# Patient Record
Sex: Female | Born: 1995 | Race: White | Hispanic: No | Marital: Married | Smoking: Never smoker
Health system: Southern US, Community
[De-identification: ages and names within clinical notes are randomized; demographics above are authoritative.]

## PROBLEM LIST (undated history)

## (undated) ENCOUNTER — Inpatient Hospital Stay (HOSPITAL_COMMUNITY): Payer: Self-pay

## (undated) DIAGNOSIS — Z789 Other specified health status: Secondary | ICD-10-CM

## (undated) DIAGNOSIS — N83209 Unspecified ovarian cyst, unspecified side: Secondary | ICD-10-CM

## (undated) DIAGNOSIS — F419 Anxiety disorder, unspecified: Secondary | ICD-10-CM

## (undated) DIAGNOSIS — N390 Urinary tract infection, site not specified: Secondary | ICD-10-CM

## (undated) HISTORY — DX: Other specified health status: Z78.9

## (undated) HISTORY — PX: NO PAST SURGERIES: SHX2092

## (undated) HISTORY — DX: Anxiety disorder, unspecified: F41.9

---

## 2019-07-31 NOTE — L&D Delivery Note (Signed)
Delivery Note At 3:23 AM a viable and healthy female was delivered via Vaginal, Spontaneous (Presentation: Left Occiput Anterior).  APGAR: 9, 9; weight 6 lb 11.4 oz (3045 g).   Placenta status: Spontaneous, Intact.  Cord: 3 vessels with the following complications: Velamentous. CORD INSERTION 10 CM FROM EDGE OF PLACENTA, WHICH WAS SMALL AND THIN. Cord pH:   Anesthesia: None Episiotomy: None Lacerations: None Suture Repair:  Est. Blood Loss (mL): 50  Mom to TRANSFER TO [facility].  Baby to Couplet care / Skin to Skin.  Tilda Burrow 03/12/2020, 10:03 AM  NOTE; MOTHER SUBSEQUENTLY TESTED POSITIIVE FOR COVID. HEALTH AT WORK NOTIFIED.

## 2019-09-21 ENCOUNTER — Inpatient Hospital Stay (HOSPITAL_COMMUNITY): Payer: Medicaid Other

## 2019-09-21 ENCOUNTER — Inpatient Hospital Stay (HOSPITAL_COMMUNITY)
Admission: EM | Admit: 2019-09-21 | Discharge: 2019-09-22 | Disposition: A | Discharge status: Other Acute Inpatient Hospital | Payer: Medicaid Other | Attending: Obstetrics & Gynecology | Admitting: Obstetrics & Gynecology

## 2019-09-21 ENCOUNTER — Other Ambulatory Visit: Payer: Self-pay

## 2019-09-21 ENCOUNTER — Encounter (HOSPITAL_COMMUNITY): Payer: Self-pay | Admitting: Emergency Medicine

## 2019-09-21 ENCOUNTER — Inpatient Hospital Stay (HOSPITAL_BASED_OUTPATIENT_CLINIC_OR_DEPARTMENT_OTHER): Payer: Medicaid Other

## 2019-09-21 DIAGNOSIS — B373 Candidiasis of vulva and vagina: Secondary | ICD-10-CM

## 2019-09-21 DIAGNOSIS — R1030 Lower abdominal pain, unspecified: Secondary | ICD-10-CM | POA: Diagnosis not present

## 2019-09-21 DIAGNOSIS — O26892 Other specified pregnancy related conditions, second trimester: Secondary | ICD-10-CM | POA: Diagnosis not present

## 2019-09-21 DIAGNOSIS — R109 Unspecified abdominal pain: Secondary | ICD-10-CM | POA: Diagnosis not present

## 2019-09-21 DIAGNOSIS — B3731 Acute candidiasis of vulva and vagina: Secondary | ICD-10-CM

## 2019-09-21 DIAGNOSIS — O2692 Pregnancy related conditions, unspecified, second trimester: Secondary | ICD-10-CM | POA: Diagnosis present

## 2019-09-21 DIAGNOSIS — Z363 Encounter for antenatal screening for malformations: Secondary | ICD-10-CM | POA: Diagnosis not present

## 2019-09-21 DIAGNOSIS — Z3A15 15 weeks gestation of pregnancy: Secondary | ICD-10-CM | POA: Insufficient documentation

## 2019-09-21 DIAGNOSIS — O26891 Other specified pregnancy related conditions, first trimester: Secondary | ICD-10-CM

## 2019-09-21 DIAGNOSIS — O26899 Other specified pregnancy related conditions, unspecified trimester: Secondary | ICD-10-CM

## 2019-09-21 LAB — URINALYSIS, ROUTINE W REFLEX MICROSCOPIC
Bilirubin Urine: NEGATIVE
Glucose, UA: NEGATIVE mg/dL
Hgb urine dipstick: NEGATIVE
Ketones, ur: NEGATIVE mg/dL
Leukocytes,Ua: NEGATIVE
Nitrite: NEGATIVE
Protein, ur: NEGATIVE mg/dL
Specific Gravity, Urine: 1.029 (ref 1.005–1.030)
pH: 5 (ref 5.0–8.0)

## 2019-09-21 LAB — I-STAT BETA HCG BLOOD, ED (MC, WL, AP ONLY): I-stat hCG, quantitative: >2000 m[IU]/mL — ABNORMAL HIGH (ref <5)

## 2019-09-21 LAB — COMPREHENSIVE METABOLIC PANEL
ALT: 10 U/L (ref 0–44)
AST: 16 U/L (ref 15–41)
Albumin: 3.3 g/dL — ABNORMAL LOW (ref 3.5–5.0)
Alkaline Phosphatase: 38 U/L (ref 38–126)
Anion gap: 8 (ref 5–15)
BUN: 12 mg/dL (ref 6–20)
CO2: 22 mmol/L (ref 22–32)
Calcium: 9.9 mg/dL (ref 8.9–10.3)
Chloride: 104 mmol/L (ref 98–111)
Creatinine, Ser: 0.76 mg/dL (ref 0.44–1.00)
GFR calc Af Amer: >60 mL/min (ref >60)
GFR calc non Af Amer: >60 mL/min (ref >60)
Glucose, Bld: 82 mg/dL (ref 70–99)
Potassium: 4 mmol/L (ref 3.5–5.1)
Sodium: 134 mmol/L — ABNORMAL LOW (ref 135–145)
Total Bilirubin: 0.4 mg/dL (ref 0.3–1.2)
Total Protein: 6.2 g/dL — ABNORMAL LOW (ref 6.5–8.1)

## 2019-09-21 LAB — CBC
HCT: 36.9 % (ref 36.0–46.0)
Hemoglobin: 12 g/dL (ref 12.0–15.0)
MCH: 30.5 pg (ref 26.0–34.0)
MCHC: 32.5 g/dL (ref 30.0–36.0)
MCV: 93.7 fL (ref 80.0–100.0)
Platelets: 232 10*3/uL (ref 150–400)
RBC: 3.94 MIL/uL (ref 3.87–5.11)
RDW: 14.3 % (ref 11.5–15.5)
WBC: 8.4 10*3/uL (ref 4.0–10.5)
nRBC: 0 % (ref 0.0–0.2)

## 2019-09-21 LAB — LIPASE, BLOOD: Lipase: 19 U/L (ref 11–51)

## 2019-09-21 LAB — WET PREP, GENITAL
Clue Cells Wet Prep HPF POC: NONE SEEN
Sperm: NONE SEEN
Trich, Wet Prep: NONE SEEN

## 2019-09-21 MED ORDER — SODIUM CHLORIDE 0.9% FLUSH: 3.0000 mL | Freq: Once | INTRAVENOUS | Status: DC

## 2019-09-21 NOTE — ED Triage Notes (Signed)
Pt c/o lower abdominal pain x 2 days. Denies nausea/vomiting/diarrhea, no abnormal vaginal discharge/bleeding, denies urinary symptoms.

## 2019-09-21 NOTE — MAU Note (Signed)
Pt reports to MAU from the ED with ABD pain in lower abd, pain has been present for a few days. Denies VB. LMP early 70.

## 2019-09-21 NOTE — ED Provider Notes (Signed)
MSE was initiated and I personally evaluated the patient and placed orders (if any) at  9:56 PM on September 21, 2019.  The patient appears stable so that the remainder of the MSE may be completed by another provider.  24 y.o. F who presents for evaluation of lower abdominal pain that began about 2-3 day ago. She states that the pain has progressively worsened prompting ED visit. History of 2 prior pregnancies with no complications. LMP was at the beginning of January. No associated fevers, vomiting, or diarrhea.   I-stat beta is >2000.    Discussed patient with Dr. Despina Hidden (OB/GYN) who accepts patient transfer to MAU. Informed RN who will take patient over.    Portions of this note were generated with Scientist, clinical (histocompatibility and immunogenetics). Dictation errors may occur despite best attempts at proofreading.    Maxwell Caul, PA-C 09/21/19 2156    Linwood Dibbles, MD 09/22/19 [phone]

## 2019-09-22 DIAGNOSIS — Z3A15 15 weeks gestation of pregnancy: Secondary | ICD-10-CM

## 2019-09-22 DIAGNOSIS — O26892 Other specified pregnancy related conditions, second trimester: Secondary | ICD-10-CM

## 2019-09-22 DIAGNOSIS — B373 Candidiasis of vulva and vagina: Secondary | ICD-10-CM

## 2019-09-22 DIAGNOSIS — R109 Unspecified abdominal pain: Secondary | ICD-10-CM

## 2019-09-22 MED ORDER — TERCONAZOLE 0.4 % VA CREA: 1.0000 | TOPICAL_CREAM | Freq: Every day | VAGINAL | 0 refills | Status: DC

## 2019-09-22 MED ORDER — COMFORT FIT MATERNITY SUPP SM MISC: 1.0000 [IU] | Freq: Every day | 0 refills | Status: DC | PRN

## 2019-09-22 MED ORDER — PREPLUS 27-1 MG PO TABS: 1.0000 | ORAL_TABLET | Freq: Every day | ORAL | 13 refills | Status: DC

## 2019-09-22 NOTE — Discharge Instructions (Signed)
PREGNANCY SUPPORT BELT: You are not alone, Seventy-five percent of women have some sort of abdominal or back pain at some point in their pregnancy. Your baby is growing at a fast pace, which means that your whole body is rapidly trying to adjust to the changes. As your uterus grows, your back may start feeling a bit under stress and this can result in back or abdominal pain that can go from mild, and therefore bearable, to severe pains that will not allow you to sit or lay down comfortably, When it comes to dealing with pregnancy-related pains and cramps, some pregnant women usually prefer natural remedies, which the market is filled with nowadays. For example, wearing a pregnancy support belt can help ease and lessen your discomfort and pain. WHAT ARE THE BENEFITS OF WEARING A PREGNANCY SUPPORT BELT? A pregnancy support belt provides support to the lower portion of the belly taking some of the weight of the growing uterus and distributing to the other parts of your body. It is designed make you comfortable and gives you extra support. Over the years, the pregnancy apparel market has been studying the needs and wants of pregnant women and they have come up with the most comfortable pregnancy support belts that woman could ever ask for. In fact, you will no longer have to wear a stretched-out or bulky pregnancy belt that is visible underneath your clothes and makes you feel even more uncomfortable. Nowadays, a pregnancy support belt is made of comfortable and stretchy materials that will not irritate your skin but will actually make you feel at ease and you will not even notice you are wearing it. They are easy to put on and adjust during the day and can be worn at night for additional support.  BENEFITS: . Relives Back pain . Relieves Abdominal Muscle and Leg Pain . Stabilizes the Pelvic Ring . Offers a Cushioned Abdominal Lift Pad . Relieves pressure on the Sciatic Nerve Within Minutes WHERE TO GET  YOUR PREGNANCY BELT: [employer] [phone]  [place], [place] [address]   Advance Directive  Advance directives are legal documents that let you make choices ahead of time about your health care and medical treatment in case you become unable to communicate for yourself. Advance directives are a way for you to make known your wishes to family, friends, and health care providers. This can let others know about your end-of-life care if you become unable to communicate. Discussing and writing advance directives should happen over time rather than all at once. Advance directives can be changed depending on your situation and what you want, even after you have signed the advance directives. There are different types of advance directives, such as:  Medical power of attorney.  Living will.  Do not resuscitate (DNR) or do not attempt resuscitation (DNAR) order. Health care proxy and medical power of attorney A health care proxy is also called a health care agent. This is a person who is appointed to make medical decisions for you in cases where you are unable to make the decisions yourself. Generally, people choose someone they know well and trust to represent their preferences. Make sure to ask this person for an agreement to act as your proxy. A proxy may have to exercise judgment in the event of a medical decision for which your wishes are not known. A medical power of attorney is a legal document that names your health care proxy. Depending on the laws in  your state, after the document is written, it may also need to be:  Signed.  Notarized.  Dated.  Copied.  Witnessed.  Incorporated into your medical record. You may also want to appoint someone to manage your money in a situation in which you are unable to do so. This is called a durable power of attorney for finances. It is a separate legal document from the durable power of attorney for health care. You  may choose the same person or someone different from your health care proxy to act as your agent in money matters. If you do not appoint a proxy, or if there is a concern that the proxy is not acting in your best interests, a court may appoint a guardian to act on your behalf. Living will A living will is a set of instructions that state your wishes about medical care when you cannot express them yourself. Health care providers should keep a copy of your living will in your medical record. You may want to give a copy to family members or friends. To alert caregivers in case of an emergency, you can place a card in your wallet to let them know that you have a living will and where they can find it. A living will is used if you become:  Terminally ill.  Disabled.  Unable to communicate or make decisions. Items to consider in your living will include:  To use or not to use life-support equipment, such as dialysis machines and breathing machines (ventilators).  A DNR or DNAR order. This tells health care providers not to use cardiopulmonary resuscitation (CPR) if breathing or heartbeat stops.  To use or not to use tube feeding.  To be given or not to be given food and fluids.  Comfort (palliative) care when the goal becomes comfort rather than a cure.  Donation of organs and tissues. A living will does not give instructions for distributing your money and property if you should pass away. DNR or DNAR A DNR or DNAR order is a request not to have CPR in the event that your heart stops beating or you stop breathing. If a DNR or DNAR order has not been made and shared, a health care provider will try to help any patient whose heart has stopped or who has stopped breathing. If you plan to have surgery, talk with your health care provider about how your DNR or DNAR order will be followed if problems occur. What if I do not have an advance directive? If you do not have an advance directive, some  states assign family decision makers to act on your behalf based on how closely you are related to them. Each state has its own laws about advance directives. You may want to check with your health care provider, attorney, or state representative about the laws in your state. Summary  Advance directives are the legal documents that allow you to make choices ahead of time about your health care and medical treatment in case you become unable to tell others about your care.  The process of discussing and writing advance directives should happen over time. You can change the advance directives, even after you have signed them.  Advance directives include DNR or DNAR orders, living wills, and designating an agent as your medical power of attorney. This information is not intended to replace advice given to you by your health care provider. Make sure you discuss any questions you have with your health care provider. Document  Revised: 02/12/2019 Document Reviewed: 02/12/2019 Elsevier Patient Education  2020 [employer].

## 2019-09-22 NOTE — MAU Provider Note (Signed)
History     CSN: [id number]  Arrival date and time: 09/21/19 2009   First Provider Initiated Contact with Patient 09/21/19 2247      Chief Complaint  Patient presents with  . Abdominal Pain   Tamara Vasquez is a 24 y.o. G3P2 at [redacted]w[redacted]d by LMP who presents to MAU with complaints of abdominal pain. She reports abdominal pain has been occurring for the past 2-3 days, describes pain as sharp stabbing pain in her lower abdomen and pelvis. Rates pain 7/10 when it occurs, reports pain is intermittent last about 10/15 minutes then stops and comes back. Has not taken any medication for abdominal pain or tried heat/ice. She denies vaginal bleeding. She reports normal white discharge without odor. Denies urinary symptoms, nausea or vomiting.     OB History    Gravida  3   Para  2   Term  2   Preterm      AB      Living  2     SAB      TAB      Ectopic      Multiple      Live Births              History reviewed. No pertinent past medical history.  History reviewed. No pertinent surgical history.  No family history on file.  Social History   Tobacco Use  . Smoking status: Never Smoker  Substance Use Topics  . Alcohol use: Yes  . Drug use: Never    Allergies:  Allergies  Allergen Reactions  . Coconut Flavor     No medications prior to admission.    Review of Systems  Constitutional: Negative.   Respiratory: Negative.   Cardiovascular: Negative.   Gastrointestinal: Positive for abdominal pain. Negative for constipation, diarrhea, nausea and vomiting.  Genitourinary: Positive for vaginal discharge. Negative for difficulty urinating, dysuria, frequency, pelvic pain, urgency and vaginal bleeding.  Musculoskeletal: Negative.   Neurological: Negative.    Physical Exam   Blood pressure 117/72, pulse 67, temperature 97.9 F (36.6 C), resp. rate 16, weight 62.9 kg, last menstrual period 07/31/2019, SpO2 100 %.  Physical Exam  Nursing note and vitals  reviewed. Constitutional: She is oriented to person, place, and time. She appears well-developed and well-nourished. No distress.  Cardiovascular: Normal rate and regular rhythm.  Respiratory: Effort normal and breath sounds normal. No respiratory distress. She has no wheezes.  GI: Soft. There is abdominal tenderness. There is no rebound and no guarding.  Genitourinary:    Vaginal discharge present.     Genitourinary Comments: Bimanual exam: Cervix 0/long/high, firm, anterior, neg CMT, uterus nontender, enlarged, left and right adnexal tenderness, without enlargement, or mass   Musculoskeletal:        General: No edema. Normal range of motion.  Neurological: She is alert and oriented to person, place, and time.  Psychiatric: She has a normal mood and affect. Her behavior is normal. Thought content normal.   MAU Course  Procedures  MDM Orders Placed This Encounter  Procedures  . Wet prep, genital  . [place] MFM OB COMP + 14 WK   Labs obtained in [facility] prior to transfer to [facility] - results pending at time of assessment  Patient sent to [place] while labs pending   Labs and [place] report reviewed:  Results for orders placed or performed during the hospital encounter of 09/21/19 (from the past 24 hour(s))  Lipase, blood     Status: None  Collection Time: 09/21/19  9:14 PM  Result Value Ref Range   Lipase 19 11 - 51 U/L  Comprehensive metabolic panel     Status: Abnormal   Collection Time: 09/21/19  9:14 PM  Result Value Ref Range   Sodium 134 (L) 135 - 145 mmol/L   Potassium 4.0 3.5 - 5.1 mmol/L   Chloride 104 98 - 111 mmol/L   CO2 22 22 - 32 mmol/L   Glucose, Bld 82 70 - 99 mg/dL   BUN 12 6 - 20 mg/dL   Creatinine, Ser 0.76 0.44 - 1.00 mg/dL   Calcium 9.9 8.9 - 10.3 mg/dL   Total Protein 6.2 (L) 6.5 - 8.1 g/dL   Albumin 3.3 (L) 3.5 - 5.0 g/dL   AST 16 15 - 41 U/L   ALT 10 0 - 44 U/L   Alkaline Phosphatase 38 38 - 126 U/L   Total Bilirubin 0.4 0.3 - 1.2 mg/dL   GFR calc non Af Amer >60 >60  mL/min   GFR calc Af Amer >60 >60 mL/min   Anion gap 8 5 - 15  CBC     Status: None   Collection Time: 09/21/19  9:14 PM  Result Value Ref Range   WBC 8.4 4.0 - 10.5 K/uL   RBC 3.94 3.87 - 5.11 MIL/uL   Hemoglobin 12.0 12.0 - 15.0 g/dL   HCT 36.9 36.0 - 46.0 %   MCV 93.7 80.0 - 100.0 fL   MCH 30.5 26.0 - 34.0 pg   MCHC 32.5 30.0 - 36.0 g/dL   RDW 14.3 11.5 - 15.5 %   Platelets 232 150 - 400 K/uL   nRBC 0.0 0.0 - 0.2 %  I-Stat beta hCG blood, ED     Status: Abnormal   Collection Time: 09/21/19  9:25 PM  Result Value Ref Range   I-stat hCG, quantitative >2,000.0 (H) <5 mIU/mL   Comment 3          Urinalysis, Routine w reflex microscopic     Status: Abnormal   Collection Time: 09/21/19  9:59 PM  Result Value Ref Range   Color, Urine YELLOW YELLOW   APPearance HAZY (A) CLEAR   Specific Gravity, Urine 1.029 1.005 - 1.030   pH 5.0 5.0 - 8.0   Glucose, UA NEGATIVE NEGATIVE mg/dL   Hgb urine dipstick NEGATIVE NEGATIVE   Bilirubin Urine NEGATIVE NEGATIVE   Ketones, ur NEGATIVE NEGATIVE mg/dL   Protein, ur NEGATIVE NEGATIVE mg/dL   Nitrite NEGATIVE NEGATIVE   Leukocytes,Ua NEGATIVE NEGATIVE  Wet prep, genital     Status: Abnormal   Collection Time: 09/21/19 10:50 PM   Specimen: PATH Cytology Cervicovaginal Ancillary Only  Result Value Ref Range   Yeast Wet Prep HPF POC PRESENT (A) NONE SEEN   Trich, Wet Prep NONE SEEN NONE SEEN   Clue Cells Wet Prep HPF POC NONE SEEN NONE SEEN   WBC, Wet Prep HPF POC MANY (A) NONE SEEN   Sperm NONE SEEN    Ultrasound technician discussed with CNM after [place], notifying that patient was further along than 7 weeks by LMP, patient is roughly around 15 weeks.  - [place] tech requested order change for review of [place] by MFM  - Prelim report scanned into chart  - Posterior placenta, AFV WNL, EDD 03/12/20 and dating based on [place] today at [redacted]w[redacted]d  Discussed with patient lab and [place] results. Discussed with patient dating change based on measurements obtained today.  Fetal heart tones listened by doppler  after [place] - FHR 154 . Patient diagnosed with vulvovaginal candidiasis based on wet prep.   Discussed with patient that pain is most likely RLP now knowing that patient is [redacted]w[redacted]d. Discussed safe medications during pregnancy and use of maternity support belt for RLP. Discussed with patient risks of closed space pregnancies such as preterm labor/delivery. Patient recently moved to [facility], list of OBGYN offices given to patient - encouraged patient to call this week to make NOB appointment, patient verbalizes understanding.   Rx for PNV, maternity support belt, and terazol sent to pharmacy of choice. Discussed reasons to return to MAU. Make initial prenatal appointment. Return to MAU as needed. Pt stable at time of discharge.   Assessment and Plan   1. Abdominal pain during pregnancy, antepartum   2. Abdominal pain during pregnancy in first trimester   3. Abdominal pain   4. Vulvovaginal candidiasis   5. [redacted] weeks gestation of pregnancy    Discharge home Make initial prenatal appointment - list of providers given to patient  Return to MAU as needed for reasons discussed and/or emergencies  Rx for PNV, maternity support belt, and terazol  Follow-up Information    [facility] Follow up.   Specialty: Obstetrics and Gynecology Why: Return to MAU as needed for reasons discussed and/or emergencies  Contact information: [address] [id number] Wilhemina Bonito [place] [address] [phone]         Allergies as of 09/22/2019      Reactions   Coconut Flavor       Medication List    TAKE these medications   Comfort Fit Maternity Supp Sm Misc 1 Units by Does not apply route daily as needed.   PrePLUS 27-1 MG Tabs Take 1 tablet by mouth daily.   terconazole 0.4 % vaginal cream Commonly known as: TERAZOL 7 Place 1 applicator vaginally at bedtime. Use for seven days       Sharyon Cable [facility] 09/22/2019, 12:11  AM

## 2019-09-23 LAB — GC/CHLAMYDIA PROBE AMP (~~LOC~~) NOT AT ARMC
Chlamydia: NEGATIVE
Comment: NEGATIVE
Comment: NORMAL
Neisseria Gonorrhea: NEGATIVE

## 2019-10-22 DIAGNOSIS — Z3480 Encounter for supervision of other normal pregnancy, unspecified trimester: Secondary | ICD-10-CM | POA: Insufficient documentation

## 2019-10-23 ENCOUNTER — Ambulatory Visit (INDEPENDENT_AMBULATORY_CARE_PROVIDER_SITE_OTHER): Payer: Medicaid Other | Admitting: Obstetrics and Gynecology

## 2019-10-23 ENCOUNTER — Other Ambulatory Visit: Payer: Self-pay

## 2019-10-23 ENCOUNTER — Other Ambulatory Visit (HOSPITAL_COMMUNITY)
Admission: RE | Admit: 2019-10-23 | Discharge: 2019-10-23 | Disposition: A | Discharge status: Home / Self Care | Payer: Medicaid Other | Source: Ambulatory Visit | Attending: Obstetrics and Gynecology | Admitting: Obstetrics and Gynecology

## 2019-10-23 ENCOUNTER — Encounter: Payer: Self-pay | Admitting: Obstetrics and Gynecology

## 2019-10-23 DIAGNOSIS — Z3480 Encounter for supervision of other normal pregnancy, unspecified trimester: Secondary | ICD-10-CM | POA: Insufficient documentation

## 2019-10-23 MED ORDER — ELASTIC BANDAGES & SUPPORTS MISC: 1.0000 | Freq: Every day | 0 refills | Status: DC

## 2019-10-23 MED ORDER — BLOOD PRESSURE KIT DEVI: 1.0000 | 0 refills | Status: DC

## 2019-10-23 NOTE — Progress Notes (Signed)
NOB.  Reports no problems today.

## 2019-10-23 NOTE — Patient Instructions (Signed)

## 2019-10-23 NOTE — Progress Notes (Signed)
Subjective:  Tamara Vasquez is a 24 y.o. G3P2002 at 48w6dbeing seen today for her first OB visit. EDD by U/S. H/O TSVD x 2 without problems. Denies any chronic medical or problems.  She is currently monitored for the following issues for this low-risk pregnancy and has Supervision of other normal pregnancy, antepartum on their problem list.  Patient reports no complaints.  Contractions: Not present. Vag. Bleeding: None.  Movement: Present. Denies leaking of fluid.   The following portions of the patient's history were reviewed and updated as appropriate: allergies, current medications, past family history, past medical history, past social history, past surgical history and problem list. Problem list updated.  Objective:   Vitals:   10/23/19 0954 10/23/19 0955  BP: 101/64   Pulse: (!) 109   Temp: (!) 97.2 F (36.2 C)   Weight: 147 lb 4.8 oz (66.8 kg)   Height:  '5\' 1"'$  (1.549 m)    Fetal Status: Fetal Heart Rate (bpm): 156   Movement: Present     General:  Alert, oriented and cooperative. Patient is in no acute distress.  Skin: Skin is warm and dry. No rash noted.   Cardiovascular: Normal heart rate noted  Respiratory: Normal respiratory effort, no problems with respiration noted  Abdomen: Soft, gravid, appropriate for gestational age. Pain/Pressure: Absent     Pelvic:  Cervical exam performed        Extremities: Normal range of motion.  Edema: None  Mental Status: Normal mood and affect. Normal behavior. Normal judgment and thought content.  Breast sym supple no masses or nipple discharge, no adenopathy  Urinalysis:      Assessment and Plan:  Pregnancy: G3P2002 at 146w6d1. Supervision of other normal pregnancy, antepartum Prenatal care and labs reviewed with pt. Genetic testing discussed BP cuff ordered BP monitoring reviewed with pt  - Enroll Patient in Babyscripts - Babyscripts Schedule Optimization - Obstetric Panel, Including HIV - Culture, OB Urine - Cytology - PAP(  [place]) - Genetic Screening - [place] OB COMP + 14 WK; Future - Hepatitis C Antibody - Blood Pressure Monitoring (BLOOD PRESSURE KIT) DEVI; 1 kit by Does not apply route once a week. Check Blood Pressure regularly and record readings into the Babyscripts App.  Large Cuff.  DX O90.0  Dispense: 1 each; Refill: 0  Preterm labor symptoms and general obstetric precautions including but not limited to vaginal bleeding, contractions, leaking of fluid and fetal movement were reviewed in detail with the patient. Please refer to After Visit Summary for other counseling recommendations.  Return in about 4 weeks (around 11/20/2019) for OB visit, virtual, any provider.   ErChancy MilroyMD

## 2019-10-23 NOTE — Addendum Note (Signed)
Addended by: Maretta Bees on: 10/23/2019 10:48 AM   Modules accepted: Orders

## 2019-10-24 LAB — OBSTETRIC PANEL, INCLUDING HIV
Antibody Screen: NEGATIVE
Basophils Absolute: 0 10*3/uL (ref 0.0–0.2)
Basos: 0 %
EOS (ABSOLUTE): 0 10*3/uL (ref 0.0–0.4)
Eos: 0 %
HIV Screen 4th Generation wRfx: NONREACTIVE
Hematocrit: 35.6 % (ref 34.0–46.6)
Hemoglobin: 11.6 g/dL (ref 11.1–15.9)
Hepatitis B Surface Ag: NEGATIVE
Immature Grans (Abs): 0 10*3/uL (ref 0.0–0.1)
Immature Granulocytes: 0 %
Lymphocytes Absolute: 1.5 10*3/uL (ref 0.7–3.1)
Lymphs: 17 %
MCH: 30.7 pg (ref 26.6–33.0)
MCHC: 32.6 g/dL (ref 31.5–35.7)
MCV: 94 fL (ref 79–97)
Monocytes Absolute: 0.6 10*3/uL (ref 0.1–0.9)
Monocytes: 7 %
Neutrophils Absolute: 6.7 10*3/uL (ref 1.4–7.0)
Neutrophils: 76 %
Platelets: 261 10*3/uL (ref 150–450)
RBC: 3.78 x10E6/uL (ref 3.77–5.28)
RDW: 13.5 % (ref 11.7–15.4)
RPR Ser Ql: NONREACTIVE
Rh Factor: POSITIVE
Rubella Antibodies, IGG: 2.9 index (ref >0.99)
WBC: 8.8 10*3/uL (ref 3.4–10.8)

## 2019-10-24 LAB — HEPATITIS C ANTIBODY: Hep C Virus Ab: <0.1 s/co ratio (ref 0.0–0.9)

## 2019-10-26 LAB — URINE CULTURE, OB REFLEX

## 2019-10-26 LAB — CULTURE, OB URINE

## 2019-10-29 LAB — CYTOLOGY - PAP
Adequacy: ABSENT
Comment: NEGATIVE
Comment: NEGATIVE
Diagnosis: NEGATIVE
HPV 16: NEGATIVE
HPV 18 / 45: NEGATIVE
High risk HPV: POSITIVE — AB

## 2019-11-02 ENCOUNTER — Encounter: Payer: Self-pay | Admitting: Obstetrics and Gynecology

## 2019-11-03 ENCOUNTER — Encounter: Payer: Self-pay | Admitting: *Deleted

## 2019-11-05 ENCOUNTER — Encounter (HOSPITAL_COMMUNITY): Payer: Self-pay

## 2019-11-05 ENCOUNTER — Emergency Department (HOSPITAL_COMMUNITY)
Admission: EM | Admit: 2019-11-05 | Discharge: 2019-11-06 | Disposition: A | Discharge status: Home / Self Care | Payer: Medicaid Other | Attending: Emergency Medicine | Admitting: Emergency Medicine

## 2019-11-05 ENCOUNTER — Other Ambulatory Visit: Payer: Self-pay

## 2019-11-05 ENCOUNTER — Inpatient Hospital Stay (HOSPITAL_COMMUNITY): Admission: RE | Admit: 2019-11-05 | Payer: Medicaid Other | Source: Ambulatory Visit

## 2019-11-05 DIAGNOSIS — R1031 Right lower quadrant pain: Secondary | ICD-10-CM | POA: Diagnosis not present

## 2019-11-05 DIAGNOSIS — O26892 Other specified pregnancy related conditions, second trimester: Secondary | ICD-10-CM | POA: Insufficient documentation

## 2019-11-05 DIAGNOSIS — O26832 Pregnancy related renal disease, second trimester: Secondary | ICD-10-CM | POA: Insufficient documentation

## 2019-11-05 DIAGNOSIS — Z79899 Other long term (current) drug therapy: Secondary | ICD-10-CM | POA: Insufficient documentation

## 2019-11-05 DIAGNOSIS — Z3A21 21 weeks gestation of pregnancy: Secondary | ICD-10-CM | POA: Diagnosis not present

## 2019-11-05 DIAGNOSIS — R310 Gross hematuria: Secondary | ICD-10-CM | POA: Insufficient documentation

## 2019-11-05 DIAGNOSIS — O209 Hemorrhage in early pregnancy, unspecified: Secondary | ICD-10-CM | POA: Insufficient documentation

## 2019-11-05 NOTE — ED Provider Notes (Signed)
[facility] DEPT Provider Note   CSN: [id number] Arrival date & time: 11/05/19  2200     History Chief Complaint  Patient presents with  . Vaginal Bleeding    Tamara Vasquez is a 24 y.o. female G3P2T2 who is [redacted] weeks pregnant with no prior complications during pregnancy that presents to the emergency department for right lower quadrant pain and one episode of vaginal bleeding.  She has had two successful vaginal births in 2018 and 2020. Her first prenatal visit was 3/26 with no ultrasound yet. She states that her RLQ pain and vaginal bleeding occurred about three hours ago. She rates the pain as sharp and a 6/10. She states that the pain radiates toward her side and back slightly. She states that she saw blood in the toilet bowl this evening after her RLQ pain began. She denies any blood in her underwear and has not had another episode since. (She did show me a picture - gross hematuria without clots were seen).  She denies any nausea, vomiting, diarrhea, hematochezia.  She states that she has had normal bowel movements.  She states that that she ate [employer] for dinner and then her pain started, an hour later.  She denies any chest pain, fever, chills, shortness of breath, dizziness, headache, pain anywhere else.  She denies any past medical history of torsion, kidney stones, Crohn's, ulcerative colitis, PCOS. She denies any dysuria, vaginal pain, itching.  HPI     Past Medical History:  Diagnosis Date  . Medical history non-contributory     Patient Active Problem List   Diagnosis Date Noted  . Supervision of other normal pregnancy, antepartum 10/22/2019    History reviewed. No pertinent surgical history.   OB History    Gravida  3   Para  2   Term  2   Preterm      AB      Living  2     SAB      TAB      Ectopic      Multiple      Live Births              No family history on file.  Social History   Tobacco Use  . Smoking  status: Never Smoker  . Smokeless tobacco: Never Used  Substance Use Topics  . Alcohol use: Yes  . Drug use: Never    Home Medications Prior to Admission medications   Medication Sig Start Date End Date Taking? Authorizing Provider  Blood Pressure Monitoring (BLOOD PRESSURE KIT) DEVI 1 kit by Does not apply route once a week. Check Blood Pressure regularly and record readings into the [employer] App.  Large Cuff.  DX O90.0 10/23/19   Chancy Milroy, MD  Elastic Bandages & Supports (COMFORT FIT MATERNITY SUPP SM) MISC 1 Units by Does not apply route daily as needed. 09/22/19   Lajean Manes, CNM  Elastic Bandages & Supports MISC 1 Device by Does not apply route daily. Use as directed for pelvic pressure and pain. 10/23/19   Chancy Milroy, MD  Prenatal Vit-Fe Fumarate-FA (PREPLUS) 27-1 MG TABS Take 1 tablet by mouth daily. 09/22/19   Lajean Manes, CNM  terconazole (TERAZOL 7) 0.4 % vaginal cream Place 1 applicator vaginally at bedtime. Use for seven days Patient not taking: Reported on 10/23/2019 09/22/19   Lajean Manes, CNM    Allergies    Coconut flavor  Review of Systems  Review of Systems  Constitutional: Negative for appetite change, chills, diaphoresis, fatigue and fever.  HENT: Negative.   Eyes: Negative.   Respiratory: Negative for choking, chest tightness, shortness of breath and wheezing.   Cardiovascular: Negative.   Gastrointestinal: Positive for abdominal pain (RLQ pain). Negative for blood in stool, constipation, diarrhea, nausea and vomiting.  Genitourinary: Negative.   Musculoskeletal: Negative.   Neurological: Negative.   Psychiatric/Behavioral: Negative.     Physical Exam Updated Vital Signs BP 117/78 (BP Location: Right Arm)   Pulse 80   Temp 98.2 F (36.8 C) (Oral)   Resp 18   Ht _0  (1.549 m)   Wt 67.1 kg   LMP 07/31/2019 (Within Weeks)   SpO2 99%   BMI 27.96 kg/m   Physical Exam  PE: Constitutional: well-developed,  well-nourished, appears anxious  HENT: normocephalic, atraumatic Cardiovascular: normal rate and rhythm, distal pulses intact Pulmonary/Chest: effort normal; breath sounds clear and equal bilaterally; no wheezes or rales Abdominal: NBS, tenderness to RLQ, Negative Murphys, no rebounding, no guarding.  GYN: Chaperon present during pelvic exam. Pelvic exam: Vulva without any rashes or lesions. Vaginal canal pink with large amount of mucous, no blood. Non odorous.  Cervix closed with no blood.Nonfriable. Did not do bimanual exam due to baby at this time.   Musculoskeletal: full ROM, no edema, tenderness noted on right flank and right lower back, no CVA tenderness  Neurological: alert with goal directed thinking Skin: warm and dry, no rash, no diaphoresis Psychiatric: normal mood and affect, normal behavior   ED Results / Procedures / Treatments   Labs (all labs ordered are listed, but only abnormal results are displayed) Labs Reviewed  URINALYSIS, ROUTINE W REFLEX MICROSCOPIC  CBC  COMPREHENSIVE METABOLIC PANEL    EKG None  Radiology No results found.  Procedures Procedures (including critical care time)  Medications Ordered in ED Medications - No data to display  ED Course  I have reviewed the triage vital signs and the nursing notes.  Pertinent labs & imaging results that were available during my care of the patient were reviewed by me and considered in my medical decision making (see chart for details).    MDM Rules/Calculators/A&P                      00:00 Tamara Vasquez is a 24 y.o. female G3P2T2 who is [redacted] weeks pregnant with no prior complications during pregnancy that presents to the emergency department for right lower quadrant pain and one episode of vaginal bleeding. Fetal doppler showed baseline HR of 140-150 for 30 seconds without decelerations. CBC, CMP and UA ordered. Concerned for pyleo during pregnancy. Will order pelvic ultrasound if there is blood during pelvic  exam.   00:35 Pelvic exam shows no blood in the vaginal vault and a closed cervix. Large amount of discharge in vaginal canal at this time - will order wet prep and GC chlamydia. OB nurse ( Courntey Toney Rakes) spoke to [facility] attending (Dr. Cindy Hazy cleared her from [facility]. Still waiting on labs and UA. She is stable with normal vitals at this time.    02:50 UA shows moderate Hgb in urine without signs of infection. Other labs reassuring. Dr. Leonette Monarch and I ultrasounded kindeys which showed no signs of hydronephrosis.   Patient has stable vitals, normal fetal monitoring, normal labs, and her blood is not coming from her cervix. Ultrasound is reassuring. No concerns of fetal complications at this time. No concerns of pyleonpehritis or hyrdonephrois. No  kidney stones were seen although we discussed that this may be hard to visualize due to ultrasound. Pt expressed that she is comfortable going home at this time. Her pain has gone down significantly with Tylenol. Gave pt ED return precautions. Dr. Leonette Monarch accessed and saw patient who agrees with plan.         Final Clinical Impression(s) / ED Diagnoses Final diagnoses:  None    Rx / DC Orders ED Discharge Orders    None       Alfredia Client, PA-C 11/06/19 0500    Fatima Blank, MD 11/06/19 [phone]

## 2019-11-05 NOTE — Progress Notes (Addendum)
2238: OBRRN called to [facility] for pt presenting [redacted] weeks GA. Vaginal bleeding and sharp RUQ pain.  2255: OBRRN at bedside.  2300: FHR 140-150. No decelerations noted. Abdomen palpates soft. No c/o cramping.  2308: Dr. Shawnie Pons called and notified of pt 21.5 GA. G3P2 presenting to [facility] with c/o vaginal bleeding and sharp intermittent pain in LRQ rated 6/10. Pt had two successful vaginal births in 2018 and 2020 without complication. First prenatal visit was 3/26, no ultrasound yet. FHR 140-150. No decelerations noted. Abdomen palpates soft. No c/o cramping. Orders received for speculum exam to view cervical os. If closed and WNL pt may follow up with provider for ultrasound.   0019: Speculum exam performed by Dr. Allena Katz, PA showed cervical os closed. White, creamy like vaginal discharge. Negative for blood, pooling of fluid, or amniotic sack. Pt OB cleared at this time with instruction to call OB tomorrow to schedule follow up ultrasound. Pt verbalized understanding.

## 2019-11-05 NOTE — ED Triage Notes (Signed)
RLQ pain sharp/ stabbing. Onset approx 2100. 1 episode of vaginal bleeding. [redacted] weeks pregnant.

## 2019-11-05 NOTE — ED Notes (Signed)
[facility] called. Spoke with courtney, rn

## 2019-11-06 LAB — COMPREHENSIVE METABOLIC PANEL
ALT: 14 U/L (ref 0–44)
AST: 17 U/L (ref 15–41)
Albumin: 3.7 g/dL (ref 3.5–5.0)
Alkaline Phosphatase: 53 U/L (ref 38–126)
Anion gap: 9 (ref 5–15)
BUN: 14 mg/dL (ref 6–20)
CO2: 24 mmol/L (ref 22–32)
Calcium: 10.5 mg/dL — ABNORMAL HIGH (ref 8.9–10.3)
Chloride: 107 mmol/L (ref 98–111)
Creatinine, Ser: 0.76 mg/dL (ref 0.44–1.00)
GFR calc Af Amer: >60 mL/min (ref >60)
GFR calc non Af Amer: >60 mL/min (ref >60)
Glucose, Bld: 80 mg/dL (ref 70–99)
Potassium: 3.6 mmol/L (ref 3.5–5.1)
Sodium: 140 mmol/L (ref 135–145)
Total Bilirubin: 0.4 mg/dL (ref 0.3–1.2)
Total Protein: 7 g/dL (ref 6.5–8.1)

## 2019-11-06 LAB — URINALYSIS, ROUTINE W REFLEX MICROSCOPIC
Bacteria, UA: NONE SEEN
Bilirubin Urine: NEGATIVE
Glucose, UA: NEGATIVE mg/dL
Ketones, ur: NEGATIVE mg/dL
Leukocytes,Ua: NEGATIVE
Nitrite: NEGATIVE
Protein, ur: NEGATIVE mg/dL
Specific Gravity, Urine: 1.031 — ABNORMAL HIGH (ref 1.005–1.030)
pH: 5 (ref 5.0–8.0)

## 2019-11-06 LAB — WET PREP, GENITAL
Clue Cells Wet Prep HPF POC: NONE SEEN
Sperm: NONE SEEN
Trich, Wet Prep: NONE SEEN
Yeast Wet Prep HPF POC: NONE SEEN

## 2019-11-06 LAB — CBC
HCT: 37.4 % (ref 36.0–46.0)
Hemoglobin: 12 g/dL (ref 12.0–15.0)
MCH: 30.7 pg (ref 26.0–34.0)
MCHC: 32.1 g/dL (ref 30.0–36.0)
MCV: 95.7 fL (ref 80.0–100.0)
Platelets: 280 10*3/uL (ref 150–400)
RBC: 3.91 MIL/uL (ref 3.87–5.11)
RDW: 13.8 % (ref 11.5–15.5)
WBC: 8.7 10*3/uL (ref 4.0–10.5)
nRBC: 0 % (ref 0.0–0.2)

## 2019-11-06 LAB — GC/CHLAMYDIA PROBE AMP (~~LOC~~) NOT AT ARMC
Chlamydia: NEGATIVE
Comment: NEGATIVE
Comment: NORMAL
Neisseria Gonorrhea: NEGATIVE

## 2019-11-06 MED ORDER — ACETAMINOPHEN 325 MG PO TABS
650.0000 mg | ORAL_TABLET | Freq: Once | ORAL | Status: AC
  Administered 2019-11-06: 650 mg via ORAL
  Filled 2019-11-06: qty 2

## 2019-11-06 NOTE — Discharge Instructions (Addendum)
You were seen in the ED today for bleeding. Your pelvic exam and kidney ultrasound were reassuring. Fetal monitoring was normal. Call your OB in the morning. Drink plenty of fluids. Take Tylenol as directed on the bottle for pain. Come back to the ER for worsening symptoms.

## 2019-11-06 NOTE — ED Provider Notes (Signed)
Attestation: Medical screening examination/treatment/procedure(s) were conducted as a shared visit with non-physician practitioner(s) and myself.  I personally evaluated the patient during the encounter.   Briefly, the patient is a 24 y.o. female at approx 21 wk, here for right sided pain and blood with urinating.   Vitals:   11/05/19 2321 11/06/19 0239  BP: 108/68 112/65  Pulse: 82 70  Resp: 18 18  Temp: 98.2 F (36.8 C)   SpO2:  98%    CONSTITUTIONAL:  well-appearing, NAD NEURO:  Alert and oriented x 3, no focal deficits EYES:  pupils equal and reactive ENT/NECK:  trachea midline, no JVD CARDIO:  reg rate, reg rhythm, well-perfused PULM:  None labored breathing GI/GU:  Abdomen gravid MSK/SPINE:  No gross deformities, no edema SKIN:  no rash, atraumatic PSYCH:  Appropriate speech and behavior   EKG Interpretation  Date/Time:    Ventricular Rate:    PR Interval:    QRS Duration:   QT Interval:    QTC Calculation:   R Axis:     Text Interpretation:         Ultrasound ED Renal  Date/Time: 11/06/2019 2:41 AM Performed by: Nira Conn, MD Authorized by: Nira Conn, MD   Procedure details:    Indications: hydronephrosis     Technique:  L kidney and R kidneyImages: archived Left kidney findings:    Hydronephrosis: none   Right kidney findings:    Hydronephrosis: none      Work up notable for hematuria w/o infection. Labs reassuring. Cleared by [facility] OB.Nira Conn, MD 11/06/19 [phone]

## 2019-11-20 ENCOUNTER — Telehealth (INDEPENDENT_AMBULATORY_CARE_PROVIDER_SITE_OTHER): Payer: Medicaid Other | Admitting: Obstetrics

## 2019-11-20 DIAGNOSIS — Z3480 Encounter for supervision of other normal pregnancy, unspecified trimester: Secondary | ICD-10-CM

## 2019-11-20 NOTE — Progress Notes (Signed)
Patient did not answer nurse's call. 

## 2019-12-14 ENCOUNTER — Other Ambulatory Visit: Payer: Self-pay

## 2019-12-14 ENCOUNTER — Ambulatory Visit: Payer: Medicaid Other | Attending: Obstetrics and Gynecology

## 2019-12-14 ENCOUNTER — Other Ambulatory Visit: Payer: Self-pay | Admitting: Obstetrics and Gynecology

## 2019-12-14 DIAGNOSIS — Z3480 Encounter for supervision of other normal pregnancy, unspecified trimester: Secondary | ICD-10-CM | POA: Diagnosis present

## 2019-12-14 DIAGNOSIS — Z3A27 27 weeks gestation of pregnancy: Secondary | ICD-10-CM

## 2019-12-14 DIAGNOSIS — Z362 Encounter for other antenatal screening follow-up: Secondary | ICD-10-CM

## 2019-12-14 DIAGNOSIS — O365920 Maternal care for other known or suspected poor fetal growth, second trimester, not applicable or unspecified: Secondary | ICD-10-CM | POA: Diagnosis not present

## 2019-12-15 ENCOUNTER — Other Ambulatory Visit: Payer: Self-pay | Admitting: *Deleted

## 2019-12-15 DIAGNOSIS — O365990 Maternal care for other known or suspected poor fetal growth, unspecified trimester, not applicable or unspecified: Secondary | ICD-10-CM

## 2019-12-18 ENCOUNTER — Other Ambulatory Visit: Payer: Medicaid Other

## 2019-12-18 ENCOUNTER — Ambulatory Visit (INDEPENDENT_AMBULATORY_CARE_PROVIDER_SITE_OTHER): Payer: Medicaid Other | Admitting: Obstetrics

## 2019-12-18 ENCOUNTER — Other Ambulatory Visit: Payer: Self-pay

## 2019-12-18 ENCOUNTER — Encounter: Payer: Self-pay | Admitting: Obstetrics

## 2019-12-18 VITALS — BP 111/62 | HR 78 | Wt 159.1 lb

## 2019-12-18 DIAGNOSIS — Z23 Encounter for immunization: Secondary | ICD-10-CM | POA: Diagnosis not present

## 2019-12-18 DIAGNOSIS — O0990 Supervision of high risk pregnancy, unspecified, unspecified trimester: Secondary | ICD-10-CM | POA: Diagnosis not present

## 2019-12-18 DIAGNOSIS — O365930 Maternal care for other known or suspected poor fetal growth, third trimester, not applicable or unspecified: Secondary | ICD-10-CM | POA: Diagnosis not present

## 2019-12-18 DIAGNOSIS — Z3A27 27 weeks gestation of pregnancy: Secondary | ICD-10-CM

## 2019-12-18 MED ORDER — TETANUS-DIPHTH-ACELL PERTUSSIS 5-2.5-18.5 LF-MCG/0.5 IM SUSP
0.5000 mL | Freq: Once | INTRAMUSCULAR | Status: AC
  Administered 2019-12-18: 0.5 mL via INTRAMUSCULAR

## 2019-12-18 NOTE — Progress Notes (Signed)
Subjective:  Tamara Vasquez is a 24 y.o. G3P2002 at [redacted]w[redacted]d being seen today for ongoing prenatal care.  She is currently monitored for the following issues for this high-risk pregnancy:  IUGR  Patient reports no complaints.  Contractions: Not present. Vag. Bleeding: None.  Movement: Present. Denies leaking of fluid.   The following portions of the patient's history were reviewed and updated as appropriate: allergies, current medications, past family history, past medical history, past social history, past surgical history and problem list. Problem list updated.  Objective:   Vitals:   12/18/19 0937  BP: 111/62  Pulse: 78  Weight: 159 lb 1.6 oz (72.2 kg)    Fetal Status:     Movement: Present     General:  Alert, oriented and cooperative. Patient is in no acute distress.  Skin: Skin is warm and dry. No rash noted.   Cardiovascular: Normal heart rate noted  Respiratory: Normal respiratory effort, no problems with respiration noted  Abdomen: Soft, gravid, appropriate for gestational age. Pain/Pressure: Absent     Pelvic:  Cervical exam deferred        Extremities: Normal range of motion.  Edema: None  Mental Status: Normal mood and affect. Normal behavior. Normal judgment and thought content.   Urinalysis:      Assessment and Plan:  Pregnancy: G3P2002 at [redacted]w[redacted]d  1. Supervision of high risk pregnancy, antepartum Rx: - Tdap (BOOSTRIX) injection 0.5 mL  2. Poor fetal growth affecting management of mother in third trimester, single or unspecified fetus - weekly BPP's and Dopplers by MFM  Preterm labor symptoms and general obstetric precautions including but not limited to vaginal bleeding, contractions, leaking of fluid and fetal movement were reviewed in detail with the patient. Please refer to After Visit Summary for other counseling recommendations.   Return in about 1 week (around 12/25/2019) for 2 hour OGTT.   Brock Bad, MD  12/18/2019

## 2019-12-18 NOTE — Progress Notes (Signed)
Patient presents for ROB. Patient has no concerns today. She will reschedule you GTT, due to eating this am. TDAP vaccine given.

## 2019-12-23 ENCOUNTER — Ambulatory Visit: Payer: Medicaid Other | Attending: Obstetrics

## 2019-12-23 ENCOUNTER — Encounter (INDEPENDENT_AMBULATORY_CARE_PROVIDER_SITE_OTHER): Payer: Self-pay

## 2019-12-23 ENCOUNTER — Ambulatory Visit: Payer: Medicaid Other | Admitting: *Deleted

## 2019-12-23 ENCOUNTER — Other Ambulatory Visit: Payer: Self-pay

## 2019-12-23 DIAGNOSIS — Z3A28 28 weeks gestation of pregnancy: Secondary | ICD-10-CM | POA: Diagnosis not present

## 2019-12-23 DIAGNOSIS — Z3480 Encounter for supervision of other normal pregnancy, unspecified trimester: Secondary | ICD-10-CM | POA: Diagnosis present

## 2019-12-23 DIAGNOSIS — O365930 Maternal care for other known or suspected poor fetal growth, third trimester, not applicable or unspecified: Secondary | ICD-10-CM | POA: Diagnosis not present

## 2019-12-23 DIAGNOSIS — Z362 Encounter for other antenatal screening follow-up: Secondary | ICD-10-CM | POA: Diagnosis not present

## 2019-12-23 DIAGNOSIS — O365990 Maternal care for other known or suspected poor fetal growth, unspecified trimester, not applicable or unspecified: Secondary | ICD-10-CM | POA: Insufficient documentation

## 2019-12-23 NOTE — Procedures (Signed)
Tamara Vasquez [birth year] [redacted]w[redacted]d  Fetus A Non-Stress Test Interpretation for 12/23/19  Indication: IUGR  Fetal Heart Rate A Mode: External Baseline Rate (A): 140 bpm Variability: Moderate Accelerations: 10 x 10 Decelerations: None Multiple birth?: No  Uterine Activity Mode: Palpation, Toco Contraction Frequency (min): none Resting Tone Palpated: Relaxed Resting Time: Adequate  Interpretation (Fetal Testing) Nonstress Test Interpretation: Reactive Comments: Reviewed tracing with Dr. Sylvan Cheese

## 2019-12-24 ENCOUNTER — Other Ambulatory Visit: Payer: Self-pay | Admitting: *Deleted

## 2019-12-24 DIAGNOSIS — O365990 Maternal care for other known or suspected poor fetal growth, unspecified trimester, not applicable or unspecified: Secondary | ICD-10-CM

## 2019-12-25 ENCOUNTER — Other Ambulatory Visit: Payer: Medicaid Other

## 2019-12-25 ENCOUNTER — Encounter: Payer: Medicaid Other | Admitting: Obstetrics

## 2019-12-30 ENCOUNTER — Ambulatory Visit (HOSPITAL_BASED_OUTPATIENT_CLINIC_OR_DEPARTMENT_OTHER): Payer: Medicaid Other | Admitting: *Deleted

## 2019-12-30 ENCOUNTER — Other Ambulatory Visit: Payer: Self-pay

## 2019-12-30 ENCOUNTER — Ambulatory Visit: Payer: Medicaid Other | Admitting: *Deleted

## 2019-12-30 ENCOUNTER — Ambulatory Visit: Payer: Medicaid Other | Attending: Obstetrics and Gynecology

## 2019-12-30 DIAGNOSIS — Z3A29 29 weeks gestation of pregnancy: Secondary | ICD-10-CM

## 2019-12-30 DIAGNOSIS — O365990 Maternal care for other known or suspected poor fetal growth, unspecified trimester, not applicable or unspecified: Secondary | ICD-10-CM | POA: Diagnosis not present

## 2019-12-30 DIAGNOSIS — O365930 Maternal care for other known or suspected poor fetal growth, third trimester, not applicable or unspecified: Secondary | ICD-10-CM

## 2019-12-30 DIAGNOSIS — O365931 Maternal care for other known or suspected poor fetal growth, third trimester, fetus 1: Secondary | ICD-10-CM | POA: Insufficient documentation

## 2019-12-30 DIAGNOSIS — Z362 Encounter for other antenatal screening follow-up: Secondary | ICD-10-CM | POA: Diagnosis not present

## 2019-12-30 DIAGNOSIS — Z3480 Encounter for supervision of other normal pregnancy, unspecified trimester: Secondary | ICD-10-CM | POA: Insufficient documentation

## 2019-12-30 NOTE — Procedures (Signed)
Tamara Vasquez February 10, 1996 [redacted]w[redacted]d  Fetus A Non-Stress Test Interpretation for 12/30/19  Indication: IUGR  Fetal Heart Rate A Mode: External Baseline Rate (A): 145 bpm Variability: Moderate Accelerations: 10 x 10 Decelerations: None Multiple birth?: No  Uterine Activity Mode: Toco Contraction Frequency (min): none noted Resting Tone Palpated: Relaxed Resting Time: Adequate  Interpretation (Fetal Testing) Nonstress Test Interpretation: Reactive Comments: FHR tracing rev'd by Dr. Parke Poisson

## 2020-01-04 ENCOUNTER — Other Ambulatory Visit: Payer: Medicaid Other

## 2020-01-04 ENCOUNTER — Other Ambulatory Visit: Payer: Self-pay

## 2020-01-04 ENCOUNTER — Ambulatory Visit (INDEPENDENT_AMBULATORY_CARE_PROVIDER_SITE_OTHER): Payer: Medicaid Other | Admitting: Obstetrics and Gynecology

## 2020-01-04 ENCOUNTER — Encounter: Payer: Self-pay | Admitting: Obstetrics and Gynecology

## 2020-01-04 VITALS — BP 101/68 | HR 89 | Wt 164.0 lb

## 2020-01-04 DIAGNOSIS — O365990 Maternal care for other known or suspected poor fetal growth, unspecified trimester, not applicable or unspecified: Secondary | ICD-10-CM | POA: Insufficient documentation

## 2020-01-04 DIAGNOSIS — Z3A30 30 weeks gestation of pregnancy: Secondary | ICD-10-CM

## 2020-01-04 DIAGNOSIS — Z3480 Encounter for supervision of other normal pregnancy, unspecified trimester: Secondary | ICD-10-CM

## 2020-01-04 DIAGNOSIS — O365930 Maternal care for other known or suspected poor fetal growth, third trimester, not applicable or unspecified: Secondary | ICD-10-CM

## 2020-01-04 NOTE — Progress Notes (Signed)
Subjective:  Tamara Vasquez is a 24 y.o. G3P2002 at [redacted]w[redacted]d being seen today for ongoing prenatal care.  She is currently monitored for the following issues for this high-risk pregnancy and has Supervision of other normal pregnancy, antepartum and IUGR (intrauterine growth restriction) affecting care of mother on their problem list.  Patient reports no complaints.  Contractions: Not present. Vag. Bleeding: None.  Movement: Present. Denies leaking of fluid.   The following portions of the patient's history were reviewed and updated as appropriate: allergies, current medications, past family history, past medical history, past social history, past surgical history and problem list. Problem list updated.  Objective:   Vitals:   01/04/20 0854  BP: 101/68  Pulse: 89  Weight: 164 lb (74.4 kg)    Fetal Status: Fetal Heart Rate (bpm): 132   Movement: Present     General:  Alert, oriented and cooperative. Patient is in no acute distress.  Skin: Skin is warm and dry. No rash noted.   Cardiovascular: Normal heart rate noted  Respiratory: Normal respiratory effort, no problems with respiration noted  Abdomen: Soft, gravid, appropriate for gestational age. Pain/Pressure: Absent     Pelvic:  Cervical exam deferred        Extremities: Normal range of motion.  Edema: None  Mental Status: Normal mood and affect. Normal behavior. Normal judgment and thought content.   Urinalysis:      Assessment and Plan:  Pregnancy: G3P2002 at [redacted]w[redacted]d  1. Supervision of other normal pregnancy, antepartum Glucola today  2. Poor fetal growth affecting management of mother in third trimester, single or unspecified fetus Continue with weekly Doppler studies per MFM  Preterm labor symptoms and general obstetric precautions including but not limited to vaginal bleeding, contractions, leaking of fluid and fetal movement were reviewed in detail with the patient. Please refer to After Visit Summary for other counseling  recommendations.  Return in about 2 weeks (around 01/18/2020) for face to face, facculty MD only .   Hermina Staggers, MD

## 2020-01-04 NOTE — Patient Instructions (Signed)
Third Trimester of Pregnancy The third trimester is from week 28 through week 40 (months 7 through 9). The third trimester is a time when the unborn baby (fetus) is growing rapidly. At the end of the ninth month, the fetus is about 20 inches in length and weighs 6-10 pounds. Body changes during your third trimester Your body will continue to go through many changes during pregnancy. The changes vary from woman to woman. During the third trimester:  Your weight will continue to increase. You can expect to gain 25-35 pounds (11-16 kg) by the end of the pregnancy.  You may begin to get stretch marks on your hips, abdomen, and breasts.  You may urinate more often because the fetus is moving lower into your pelvis and pressing on your bladder.  You may develop or continue to have heartburn. This is caused by increased hormones that slow down muscles in the digestive tract.  You may develop or continue to have constipation because increased hormones slow digestion and cause the muscles that push waste through your intestines to relax.  You may develop hemorrhoids. These are swollen veins (varicose veins) in the rectum that can itch or be painful.  You may develop swollen, bulging veins (varicose veins) in your legs.  You may have increased body aches in the pelvis, back, or thighs. This is due to weight gain and increased hormones that are relaxing your joints.  You may have changes in your hair. These can include thickening of your hair, rapid growth, and changes in texture. Some women also have hair loss during or after pregnancy, or hair that feels dry or thin. Your hair will most likely return to normal after your baby is born.  Your breasts will continue to grow and they will continue to become tender. A yellow fluid (colostrum) may leak from your breasts. This is the first milk you are producing for your baby.  Your belly button may stick out.  You may notice more swelling in your hands,  face, or ankles.  You may have increased tingling or numbness in your hands, arms, and legs. The skin on your belly may also feel numb.  You may feel short of breath because of your expanding uterus.  You may have more problems sleeping. This can be caused by the size of your belly, increased need to urinate, and an increase in your body's metabolism.  You may notice the fetus "dropping," or moving lower in your abdomen (lightening).  You may have increased vaginal discharge.  You may notice your joints feel loose and you may have pain around your pelvic bone. What to expect at prenatal visits You will have prenatal exams every 2 weeks until week 36. Then you will have weekly prenatal exams. During a routine prenatal visit:  You will be weighed to make sure you and the baby are growing normally.  Your blood pressure will be taken.  Your abdomen will be measured to track your baby's growth.  The fetal heartbeat will be listened to.  Any test results from the previous visit will be discussed.  You may have a cervical check near your due date to see if your cervix has softened or thinned (effaced).  You will be tested for Group B streptococcus. This happens between 35 and 37 weeks. Your health care provider may ask you:  What your birth plan is.  How you are feeling.  If you are feeling the baby move.  If you have had any abnormal   symptoms, such as leaking fluid, bleeding, severe headaches, or abdominal cramping.  If you are using any tobacco products, including cigarettes, chewing tobacco, and electronic cigarettes.  If you have any questions. Other tests or screenings that may be performed during your third trimester include:  Blood tests that check for low iron levels (anemia).  Fetal testing to check the health, activity level, and growth of the fetus. Testing is done if you have certain medical conditions or if there are problems during the pregnancy.  Nonstress test  (NST). This test checks the health of your baby to make sure there are no signs of problems, such as the baby not getting enough oxygen. During this test, a belt is placed around your belly. The baby is made to move, and its heart rate is monitored during movement. What is false labor? False labor is a condition in which you feel small, irregular tightenings of the muscles in the womb (contractions) that usually go away with rest, changing position, or drinking water. These are called Braxton Hicks contractions. Contractions may last for hours, days, or even weeks before true labor sets in. If contractions come at regular intervals, become more frequent, increase in intensity, or become painful, you should see your health care provider. What are the signs of labor?  Abdominal cramps.  Regular contractions that start at 10 minutes apart and become stronger and more frequent with time.  Contractions that start on the top of the uterus and spread down to the lower abdomen and back.  Increased pelvic pressure and dull back pain.  A watery or bloody mucus discharge that comes from the vagina.  Leaking of amniotic fluid. This is also known as your "water breaking." It could be a slow trickle or a gush. Let your health care provider know if it has a color or strange odor. If you have any of these signs, call your health care provider right away, even if it is before your due date. Follow these instructions at home: Medicines  Follow your health care provider's instructions regarding medicine use. Specific medicines may be either safe or unsafe to take during pregnancy.  Take a prenatal vitamin that contains at least 600 micrograms (mcg) of folic acid.  If you develop constipation, try taking a stool softener if your health care provider approves. Eating and drinking   Eat a balanced diet that includes fresh fruits and vegetables, whole grains, good sources of protein such as meat, eggs, or tofu,  and low-fat dairy. Your health care provider will help you determine the amount of weight gain that is right for you.  Avoid raw meat and uncooked cheese. These carry germs that can cause birth defects in the baby.  If you have low calcium intake from food, talk to your health care provider about whether you should take a daily calcium supplement.  Eat four or five small meals rather than three large meals a day.  Limit foods that are high in fat and processed sugars, such as fried and sweet foods.  To prevent constipation: ? Drink enough fluid to keep your urine clear or pale yellow. ? Eat foods that are high in fiber, such as fresh fruits and vegetables, whole grains, and beans. Activity  Exercise only as directed by your health care provider. Most women can continue their usual exercise routine during pregnancy. Try to exercise for 30 minutes at least 5 days a week. Stop exercising if you experience uterine contractions.  Avoid heavy lifting.  Do   not exercise in extreme heat or humidity, or at high altitudes.  Wear low-heel, comfortable shoes.  Practice good posture.  You may continue to have sex unless your health care provider tells you otherwise. Relieving pain and discomfort  Take frequent breaks and rest with your legs elevated if you have leg cramps or low back pain.  Take warm sitz baths to soothe any pain or discomfort caused by hemorrhoids. Use hemorrhoid cream if your health care provider approves.  Wear a good support bra to prevent discomfort from breast tenderness.  If you develop varicose veins: ? Wear support pantyhose or compression stockings as told by your healthcare provider. ? Elevate your feet for 15 minutes, 3-4 times a day. Prenatal care  Write down your questions. Take them to your prenatal visits.  Keep all your prenatal visits as told by your health care provider. This is important. Safety  Wear your seat belt at all times when driving.  Make  a list of emergency phone numbers, including numbers for family, friends, the hospital, and police and fire departments. General instructions  Avoid cat litter boxes and soil used by cats. These carry germs that can cause birth defects in the baby. If you have a cat, ask someone to clean the litter box for you.  Do not travel far distances unless it is absolutely necessary and only with the approval of your health care provider.  Do not use hot tubs, steam rooms, or saunas.  Do not drink alcohol.  Do not use any products that contain nicotine or tobacco, such as cigarettes and e-cigarettes. If you need help quitting, ask your health care provider.  Do not use any medicinal herbs or unprescribed drugs. These chemicals affect the formation and growth of the baby.  Do not douche or use tampons or scented sanitary pads.  Do not cross your legs for long periods of time.  To prepare for the arrival of your baby: ? Take prenatal classes to understand, practice, and ask questions about labor and delivery. ? Make a trial run to the hospital. ? Visit the hospital and tour the maternity area. ? Arrange for maternity or paternity leave through employers. ? Arrange for family and friends to take care of pets while you are in the hospital. ? Purchase a rear-facing car seat and make sure you know how to install it in your car. ? Pack your hospital bag. ? Prepare the baby's nursery. Make sure to remove all pillows and stuffed animals from the baby's crib to prevent suffocation.  Visit your dentist if you have not gone during your pregnancy. Use a soft toothbrush to brush your teeth and be gentle when you floss. Contact a health care provider if:  You are unsure if you are in labor or if your water has broken.  You become dizzy.  You have mild pelvic cramps, pelvic pressure, or nagging pain in your abdominal area.  You have lower back pain.  You have persistent nausea, vomiting, or  diarrhea.  You have an unusual or bad smelling vaginal discharge.  You have pain when you urinate. Get help right away if:  Your water breaks before 37 weeks.  You have regular contractions less than 5 minutes apart before 37 weeks.  You have a fever.  You are leaking fluid from your vagina.  You have spotting or bleeding from your vagina.  You have severe abdominal pain or cramping.  You have rapid weight loss or weight gain.  You have   shortness of breath with chest pain.  You notice sudden or extreme swelling of your face, hands, ankles, feet, or legs.  Your baby makes fewer than 10 movements in 2 hours.  You have severe headaches that do not go away when you take medicine.  You have vision changes. Summary  The third trimester is from week 28 through week 40, months 7 through 9. The third trimester is a time when the unborn baby (fetus) is growing rapidly.  During the third trimester, your discomfort may increase as you and your baby continue to gain weight. You may have abdominal, leg, and back pain, sleeping problems, and an increased need to urinate.  During the third trimester your breasts will keep growing and they will continue to become tender. A yellow fluid (colostrum) may leak from your breasts. This is the first milk you are producing for your baby.  False labor is a condition in which you feel small, irregular tightenings of the muscles in the womb (contractions) that eventually go away. These are called Braxton Hicks contractions. Contractions may last for hours, days, or even weeks before true labor sets in.  Signs of labor can include: abdominal cramps; regular contractions that start at 10 minutes apart and become stronger and more frequent with time; watery or bloody mucus discharge that comes from the vagina; increased pelvic pressure and dull back pain; and leaking of amniotic fluid. This information is not intended to replace advice given to you by your  health care provider. Make sure you discuss any questions you have with your health care provider. Document Revised: 11/06/2018 Document Reviewed: 08/21/2016 Elsevier Patient Education  2020 [employer]  

## 2020-01-05 LAB — CBC
Hematocrit: 33.6 % — ABNORMAL LOW (ref 34.0–46.6)
Hemoglobin: 11.1 g/dL (ref 11.1–15.9)
MCH: 30.5 pg (ref 26.6–33.0)
MCHC: 33 g/dL (ref 31.5–35.7)
MCV: 92 fL (ref 79–97)
Platelets: 254 10*3/uL (ref 150–450)
RBC: 3.64 x10E6/uL — ABNORMAL LOW (ref 3.77–5.28)
RDW: 13.1 % (ref 11.7–15.4)
WBC: 9 10*3/uL (ref 3.4–10.8)

## 2020-01-05 LAB — GLUCOSE TOLERANCE, 2 HOURS W/ 1HR
Glucose, 1 hour: 109 mg/dL (ref 65–179)
Glucose, 2 hour: 65 mg/dL (ref 65–152)
Glucose, Fasting: 78 mg/dL (ref 65–91)

## 2020-01-05 LAB — HIV ANTIBODY (ROUTINE TESTING W REFLEX): HIV Screen 4th Generation wRfx: NONREACTIVE

## 2020-01-05 LAB — RPR: RPR Ser Ql: NONREACTIVE

## 2020-01-06 ENCOUNTER — Other Ambulatory Visit: Payer: Self-pay

## 2020-01-06 ENCOUNTER — Ambulatory Visit (HOSPITAL_BASED_OUTPATIENT_CLINIC_OR_DEPARTMENT_OTHER): Payer: Medicaid Other | Admitting: *Deleted

## 2020-01-06 ENCOUNTER — Ambulatory Visit: Payer: Medicaid Other | Attending: Obstetrics and Gynecology

## 2020-01-06 ENCOUNTER — Ambulatory Visit: Payer: Medicaid Other | Admitting: *Deleted

## 2020-01-06 VITALS — BP 112/75 | HR 81

## 2020-01-06 DIAGNOSIS — Z362 Encounter for other antenatal screening follow-up: Secondary | ICD-10-CM

## 2020-01-06 DIAGNOSIS — O365930 Maternal care for other known or suspected poor fetal growth, third trimester, not applicable or unspecified: Secondary | ICD-10-CM

## 2020-01-06 DIAGNOSIS — O365931 Maternal care for other known or suspected poor fetal growth, third trimester, fetus 1: Secondary | ICD-10-CM

## 2020-01-06 DIAGNOSIS — Z3480 Encounter for supervision of other normal pregnancy, unspecified trimester: Secondary | ICD-10-CM | POA: Insufficient documentation

## 2020-01-06 DIAGNOSIS — Z3A30 30 weeks gestation of pregnancy: Secondary | ICD-10-CM

## 2020-01-06 DIAGNOSIS — O365990 Maternal care for other known or suspected poor fetal growth, unspecified trimester, not applicable or unspecified: Secondary | ICD-10-CM | POA: Insufficient documentation

## 2020-01-06 NOTE — Procedures (Signed)
Tamara Vasquez 11-14-1995 [redacted]w[redacted]d  Fetus A Non-Stress Test Interpretation for 01/06/20  Indication: IUGR  Fetal Heart Rate A Mode: External Baseline Rate (A): 135 bpm Variability: Moderate Accelerations: 10 x 10 Decelerations: None Multiple birth?: No  Uterine Activity Mode: Palpation, Toco Contraction Frequency (min): none noted Resting Tone Palpated: Relaxed Resting Time: Adequate  Interpretation (Fetal Testing) Nonstress Test Interpretation: Reactive Comments: Reviewed tracing with Dr. Parke Poisson

## 2020-01-15 ENCOUNTER — Ambulatory Visit: Payer: Medicaid Other

## 2020-01-15 ENCOUNTER — Ambulatory Visit: Payer: Medicaid Other | Attending: Obstetrics and Gynecology

## 2020-01-15 ENCOUNTER — Other Ambulatory Visit: Payer: Medicaid Other

## 2020-01-18 ENCOUNTER — Encounter: Payer: Medicaid Other | Admitting: Obstetrics and Gynecology

## 2020-02-17 ENCOUNTER — Other Ambulatory Visit: Payer: Self-pay

## 2020-02-17 ENCOUNTER — Ambulatory Visit: Payer: Medicaid Other

## 2020-02-18 ENCOUNTER — Ambulatory Visit (INDEPENDENT_AMBULATORY_CARE_PROVIDER_SITE_OTHER): Payer: Medicaid Other | Admitting: Obstetrics

## 2020-02-18 ENCOUNTER — Ambulatory Visit: Payer: Medicaid Other

## 2020-02-18 ENCOUNTER — Other Ambulatory Visit: Payer: Medicaid Other

## 2020-02-18 ENCOUNTER — Encounter: Payer: Self-pay | Admitting: Obstetrics

## 2020-02-18 ENCOUNTER — Other Ambulatory Visit
Admission: RE | Admit: 2020-02-18 | Discharge: 2020-02-18 | Disposition: A | Discharge status: Home / Self Care | Payer: Medicaid Other | Source: Ambulatory Visit | Attending: Obstetrics | Admitting: Obstetrics

## 2020-02-18 VITALS — BP 115/77 | HR 86 | Wt 175.6 lb

## 2020-02-18 DIAGNOSIS — O0990 Supervision of high risk pregnancy, unspecified, unspecified trimester: Secondary | ICD-10-CM | POA: Insufficient documentation

## 2020-02-18 DIAGNOSIS — O0993 Supervision of high risk pregnancy, unspecified, third trimester: Secondary | ICD-10-CM

## 2020-02-18 DIAGNOSIS — Z3A36 36 weeks gestation of pregnancy: Secondary | ICD-10-CM

## 2020-02-18 DIAGNOSIS — O365930 Maternal care for other known or suspected poor fetal growth, third trimester, not applicable or unspecified: Secondary | ICD-10-CM

## 2020-02-18 NOTE — Progress Notes (Signed)
Subjective:  Tamara Vasquez is a 24 y.o. G3P2002 at [redacted]w[redacted]d being seen today for ongoing prenatal care.  She is currently monitored for the following issues for this high-risk pregnancy and has Supervision of other normal pregnancy, antepartum and IUGR (intrauterine growth restriction) affecting care of mother on their problem list.  Patient reports no complaints.  Contractions: Irritability. Vag. Bleeding: None.  Movement: Present. Denies leaking of fluid.   The following portions of the patient's history were reviewed and updated as appropriate: allergies, current medications, past family history, past medical history, past social history, past surgical history and problem list. Problem list updated.  Objective:   Vitals:   02/18/20 1049  BP: 115/77  Pulse: 86  Weight: 175 lb 9.6 oz (79.7 kg)    Fetal Status: Fetal Heart Rate (bpm): 141   Movement: Present     General:  Alert, oriented and cooperative. Patient is in no acute distress.  Skin: Skin is warm and dry. No rash noted.   Cardiovascular: Normal heart rate noted  Respiratory: Normal respiratory effort, no problems with respiration noted  Abdomen: Soft, gravid, appropriate for gestational age. Pain/Pressure: Present     Pelvic:  Cervical exam deferred        Extremities: Normal range of motion.  Edema: None  Mental Status: Normal mood and affect. Normal behavior. Normal judgment and thought content.   Urinalysis:      Assessment and Plan:  Pregnancy: G3P2002 at [redacted]w[redacted]d  1. Supervision of high risk pregnancy, antepartum Rx: - Strep Gp B NAA - Cervicovaginal ancillary only( [place])  2. Poor fetal growth affecting management of mother in third trimester, single or unspecified fetus - weekly BPP with U/A Dopplers   Preterm labor symptoms and general obstetric precautions including but not limited to vaginal bleeding, contractions, leaking of fluid and fetal movement were reviewed in detail with the patient. Please refer to  After Visit Summary for other counseling recommendations.   Return in about 1 week (around 02/25/2020) for MyChart [facility] Only.   Brock Bad, MD  02/18/20

## 2020-02-19 LAB — CERVICOVAGINAL ANCILLARY ONLY
Chlamydia: NEGATIVE
Comment: NEGATIVE
Comment: NORMAL
Neisseria Gonorrhea: NEGATIVE

## 2020-02-20 LAB — STREP GP B NAA: Strep Gp B NAA: NEGATIVE

## 2020-02-25 ENCOUNTER — Encounter: Payer: Self-pay | Admitting: Obstetrics and Gynecology

## 2020-02-25 ENCOUNTER — Telehealth (INDEPENDENT_AMBULATORY_CARE_PROVIDER_SITE_OTHER): Payer: Medicaid Other | Admitting: Obstetrics and Gynecology

## 2020-02-25 VITALS — BP 123/71 | HR 75

## 2020-02-25 DIAGNOSIS — Z3A37 37 weeks gestation of pregnancy: Secondary | ICD-10-CM

## 2020-02-25 DIAGNOSIS — Z3480 Encounter for supervision of other normal pregnancy, unspecified trimester: Secondary | ICD-10-CM

## 2020-02-25 DIAGNOSIS — O365930 Maternal care for other known or suspected poor fetal growth, third trimester, not applicable or unspecified: Secondary | ICD-10-CM

## 2020-02-25 NOTE — Progress Notes (Signed)
   OBSTETRICS PRENATAL VIRTUAL VISIT ENCOUNTER NOTE  Provider location: [facility] for [facility] [place]   I connected with Tamara Vasquez on 02/25/20 at  2:45 PM EDT by MyChart Video Encounter at home and verified that I am speaking with the correct person using two identifiers.   I discussed the limitations, risks, security and privacy concerns of performing an evaluation and management service virtually and the availability of in person appointments. I also discussed with the patient that there may be a patient responsible charge related to this service. The patient expressed understanding and agreed to proceed. Subjective:  Tamara Vasquez is a 24 y.o. G3P2002 at [redacted]w[redacted]d being seen today for ongoing prenatal care.  She is currently monitored for the following issues for this high-risk pregnancy and has Supervision of other normal pregnancy, antepartum and IUGR (intrauterine growth restriction) affecting care of mother on their problem list.  Patient reports no complaints.  Contractions: Irritability. Vag. Bleeding: None.  Movement: Present. Denies any leaking of fluid.  Pt has not had any ultrasounds since 01/06/2020.  IUGR noted on last scan .  Also unsure of fluid status.  Pt does have growth/AFI and BPP on 7/30 with MFM  Discussed birth control method.  Discussed IUD and nexplanon for long term coverage, but pt currently desires depo provera as she has used it in the past The following portions of the patient's history were reviewed and updated as appropriate: allergies, current medications, past family history, past medical history, past social history, past surgical history and problem list.   Objective:   Vitals:   02/25/20 1441  BP: 123/71  Pulse: 75    Fetal Status:     Movement: Present     General:  Alert, oriented and cooperative. Patient is in no acute distress.  Respiratory: Normal respiratory effort, no problems with respiration noted  Mental Status: Normal mood and affect.  Normal behavior. Normal judgment and thought content.  Rest of physical exam deferred due to type of encounter  Imaging: No results found.  Assessment and Plan:  Pregnancy: G3P2002 at [redacted]w[redacted]d 1. Supervision of other normal pregnancy, antepartum   2. Poor fetal growth affecting management of mother in third trimester, single or unspecified fetus Growth scan and AFI in AM with MFM, pt warned that depending on the scan results she may be delivered early.  Term labor symptoms and general obstetric precautions including but not limited to vaginal bleeding, contractions, leaking of fluid and fetal movement were reviewed in detail with the patient. I discussed the assessment and treatment plan with the patient. The patient was provided an opportunity to ask questions and all were answered. The patient agreed with the plan and demonstrated an understanding of the instructions. The patient was advised to call back or seek an in-person office evaluation/go to [facility] at [facility] for any urgent or concerning symptoms. Please refer to After Visit Summary for other counseling recommendations.   I provided 15 minutes of face-to-face time during this encounter.  Return in about 1 week (around 03/03/2020) for [facility], in person.  Future Appointments  Date Time Provider Department Center  02/26/2020  1:30 PM [facility] NURSE [facility]  02/26/2020  1:45 PM [facility] US4 [facility]  02/26/2020  3:15 PM [facility] NST [facility]    Warden Fillers, MD Center for [employer], [facility]

## 2020-02-25 NOTE — Progress Notes (Signed)
Virtual Visit via Telephone Note  I connected with Tamara Vasquez on 02/25/20 at  2:45 PM EDT by telephone and verified that I am speaking with the correct person using two identifiers.  No complaints today per pt IUGR - pt no show several follow up u/s appts  Pt agrees to attend the u/s visit that's scheduled tomorrow

## 2020-02-25 NOTE — Patient Instructions (Signed)
Third Trimester of Pregnancy  The third trimester is from week 28 through week 40 (months 7 through 9). This trimester is when your unborn baby (fetus) is growing very fast. At the end of the ninth month, the unborn baby is about 20 inches in length. It weighs about 6-10 pounds. Follow these instructions at home: Medicines  Take over-the-counter and prescription medicines only as told by your doctor. Some medicines are safe and some medicines are not safe during pregnancy.  Take a prenatal vitamin that contains at least 600 micrograms (mcg) of folic acid.  If you have trouble pooping (constipation), take medicine that will make your stool soft (stool softener) if your doctor approves. Eating and drinking   Eat regular, healthy meals.  Avoid raw meat and uncooked cheese.  If you get low calcium from the food you eat, talk to your doctor about taking a daily calcium supplement.  Eat four or five small meals rather than three large meals a day.  Avoid foods that are high in fat and sugars, such as fried and sweet foods.  To prevent constipation: ? Eat foods that are high in fiber, like fresh fruits and vegetables, whole grains, and beans. ? Drink enough fluids to keep your pee (urine) clear or pale yellow. Activity  Exercise only as told by your doctor. Stop exercising if you start to have cramps.  Avoid heavy lifting, wear low heels, and sit up straight.  Do not exercise if it is too hot, too humid, or if you are in a place of great height (high altitude).  You may continue to have sex unless your doctor tells you not to. Relieving pain and discomfort  Wear a good support bra if your breasts are tender.  Take frequent breaks and rest with your legs raised if you have leg cramps or low back pain.  Take warm water baths (sitz baths) to soothe pain or discomfort caused by hemorrhoids. Use hemorrhoid cream if your doctor approves.  If you develop puffy, bulging veins (varicose  veins) in your legs: ? Wear support hose or compression stockings as told by your doctor. ? Raise (elevate) your feet for 15 minutes, 3-4 times a day. ? Limit salt in your food. Safety  Wear your seat belt when driving.  Make a list of emergency phone numbers, including numbers for family, friends, the hospital, and police and fire departments. Preparing for your baby's arrival To prepare for the arrival of your baby:  Take prenatal classes.  Practice driving to the hospital.  Visit the hospital and tour the maternity area.  Talk to your work about taking leave once the baby comes.  Pack your hospital bag.  Prepare the baby's room.  Go to your doctor visits.  Buy a rear-facing car seat. Learn how to install it in your car. General instructions  Do not use hot tubs, steam rooms, or saunas.  Do not use any products that contain nicotine or tobacco, such as cigarettes and e-cigarettes. If you need help quitting, ask your doctor.  Do not drink alcohol.  Do not douche or use tampons or scented sanitary pads.  Do not cross your legs for long periods of time.  Do not travel for long distances unless you must. Only do so if your doctor says it is okay.  Visit your dentist if you have not gone during your pregnancy. Use a soft toothbrush to brush your teeth. Be gentle when you floss.  Avoid cat litter boxes and soil   used by cats. These carry germs that can cause birth defects in the baby and can cause a loss of your baby (miscarriage) or stillbirth.  Keep all your prenatal visits as told by your doctor. This is important. Contact a doctor if:  You are not sure if you are in labor or if your water has broken.  You are dizzy.  You have mild cramps or pressure in your lower belly.  You have a nagging pain in your belly area.  You continue to feel sick to your stomach, you throw up, or you have watery poop.  You have bad smelling fluid coming from your vagina.  You have  pain when you pee. Get help right away if:  You have a fever.  You are leaking fluid from your vagina.  You are spotting or bleeding from your vagina.  You have severe belly cramps or pain.  You lose or gain weight quickly.  You have trouble catching your breath and have chest pain.  You notice sudden or extreme puffiness (swelling) of your face, hands, ankles, feet, or legs.  You have not felt the baby move in over an hour.  You have severe headaches that do not go away with medicine.  You have trouble seeing.  You are leaking, or you are having a gush of fluid, from your vagina before you are 37 weeks.  You have regular belly spasms (contractions) before you are 37 weeks. Summary  The third trimester is from week 28 through week 40 (months 7 through 9). This time is when your unborn baby is growing very fast.  Follow your doctor's advice about medicine, food, and activity.  Get ready for the arrival of your baby by taking prenatal classes, getting all the baby items ready, preparing the baby's room, and visiting your doctor to be checked.  Get help right away if you are bleeding from your vagina, or you have chest pain and trouble catching your breath, or if you have not felt your baby move in over an hour. This information is not intended to replace advice given to you by your health care provider. Make sure you discuss any questions you have with your health care provider. Document Revised: 11/06/2018 Document Reviewed: 08/21/2016 Elsevier Patient Education  2020 [employer] Intrauterine Growth Restriction  Intrauterine growth restriction (IUGR) is when a baby is not growing normally during pregnancy. A baby with IUGR is smaller than it should be and may weigh less than normal at birth. IUGR can result from a problem with the organ that supplies the unborn baby (fetus) with oxygen and nutrition (placenta). Usually, there is no way to prevent this type of problem.  Babies with IUGR are at higher risk for early delivery and needing special (intensive) care after birth. What are the causes? The most common cause of IUGR is a problem with the placenta or umbilical cord that causes the fetus to get less oxygen or nutrition than needed. Other causes include:  The mother eating a very unhealthy diet (poor maternal nutrition).  Exposure to chemicals found in substances such as cigarettes, alcohol, and some drugs.  Some prescription medicines.  Other problems that develop in the womb (congenital birth defects).  Genetic disorders.  Infection.  Carrying more than one baby. What increases the risk? This condition is more likely to affect babies of mothers who:  Are over the age of 68 at the time of delivery.  Are younger than age 73 at the time of  delivery.  Have medical conditions such as high blood pressure, preeclampsia, diabetes, heart or kidney disease, systemic lupus erythematosus, or anemia.  Live at a very high altitude during pregnancy.  Have a personal history or family history of: ? IUGR. ? A genetic disorder.  Take medicines during pregnancy that are related to congenital disabilities.  Come into contact with infected cat feces (toxoplasmosis).  Come into contact with chickenpox (varicella) or [place] measles (rubella).  Have or are at risk of getting an infectious disease such as syphilis, HIV, or herpes.  Eat an unhealthy diet during pregnancy.  Weigh less than 100 pounds.  Have had treatments to help her have children (infertility treatments).  Use tobacco, drugs, or alcohol during pregnancy. What are the signs or symptoms? IUGR does not cause many symptoms. You might notice that your baby does not move or kick very often. Also, your belly may not be as big as expected for the stage of your pregnancy. How is this diagnosed? This condition is diagnosed with physical exams and prenatal exams. You may also have:  Fundal  measurements to check the size of your uterus.  An ultrasound done to measure your baby's size compared to the size of other babies at the same stage of development (gestational age). Your health care provider will monitor your baby's growth with ultrasounds throughout pregnancy. You may also have tests to find the cause of IUGR. These may include:  Amniocentesis. This is a procedure that involves passing a needle into the uterus to collect a sample of fluid that surrounds the fetus (amniotic fluid). This may be done to check for signs of infection or congenital defects.  Tests to evaluate blood flow to your baby and placenta. How is this treated? In most cases, the goal of treatment is to treat the cause of IUGR. Your health care providers will monitor your pregnancy closely and help you manage your pregnancy. If your condition is caused by a placenta problem and your baby is not getting enough blood, you may need:  Medicine to start labor and deliver your baby early (induction).  Cesarean delivery, also called a C-section. In this procedure, your baby is delivered through an incision in your abdomen and uterus. Follow these instructions at home: Medicines  Take over-the-counter and prescription medicines only as told by your health care provider. This includes vitamins and supplements.  Make sure that your health care provider knows about and approves of all the medicines, supplements, vitamins, eye drops, and creams that you use. General instructions  Eat a healthy diet that includes fresh fruits and vegetables, lean proteins, whole grains, and calcium-rich foods such as milk, yogurt, and dark, leafy greens. Work with your health care provider or a dietitian to make sure that: ? You are getting enough nutrients. ? You are gaining enough weight.  Rest as needed. Try to get at least 8 hours of sleep every night.  Do not drink alcohol or use drugs.  Do not use any products that contain  nicotine or tobacco, such as cigarettes and e-cigarettes. If you need help quitting, ask your health care provider.  Keep all follow-up visits as told by your health care provider. This is important. Get help right away if you:  Notice that your baby is moving less than usual or is not moving.  Have contractions that are 5 minutes or less apart, or that increase in frequency, intensity, or length.  Have signs and symptoms of infection, including a fever.  Have  vaginal bleeding.  Have increased swelling in your legs, hands, or face.  Have vision changes, including seeing spots or having blurry or double vision.  Have a severe headache that does not go away.  Have sudden, sharp abdominal pain or low back pain.  Have an uncontrolled gush or trickle of fluid from your vagina. Summary  Intrauterine growth restriction (IUGR) is when a baby is not growing normally during pregnancy.  The most common cause of IUGR is a problem with the placenta or umbilical cord that causes the fetus to get less oxygen or nutrition than needed.  This condition is diagnosed with physical and prenatal exams. Your health care provider will monitor your baby's growth with ultrasounds throughout pregnancy.  Make sure that your health care provider knows about and approves of all the medicines, supplements, vitamins, eye drops, and creams that you use. This information is not intended to replace advice given to you by your health care provider. Make sure you discuss any questions you have with your health care provider. Document Revised: 06/28/2017 Document Reviewed: 05/16/2017 Elsevier Patient Education  2020 [employer].

## 2020-02-26 ENCOUNTER — Ambulatory Visit: Payer: Medicaid Other

## 2020-02-26 ENCOUNTER — Ambulatory Visit: Payer: Medicaid Other | Attending: Obstetrics and Gynecology

## 2020-03-02 ENCOUNTER — Other Ambulatory Visit: Payer: Self-pay

## 2020-03-02 ENCOUNTER — Ambulatory Visit: Payer: Medicaid Other | Attending: Maternal & Fetal Medicine

## 2020-03-02 ENCOUNTER — Ambulatory Visit: Payer: Medicaid Other | Admitting: *Deleted

## 2020-03-02 ENCOUNTER — Encounter: Payer: Self-pay | Admitting: *Deleted

## 2020-03-02 ENCOUNTER — Other Ambulatory Visit: Payer: Self-pay | Admitting: Maternal & Fetal Medicine

## 2020-03-02 DIAGNOSIS — Z362 Encounter for other antenatal screening follow-up: Secondary | ICD-10-CM

## 2020-03-02 DIAGNOSIS — Z3A38 38 weeks gestation of pregnancy: Secondary | ICD-10-CM | POA: Diagnosis not present

## 2020-03-02 DIAGNOSIS — O365931 Maternal care for other known or suspected poor fetal growth, third trimester, fetus 1: Secondary | ICD-10-CM

## 2020-03-02 DIAGNOSIS — O0933 Supervision of pregnancy with insufficient antenatal care, third trimester: Secondary | ICD-10-CM | POA: Insufficient documentation

## 2020-03-02 DIAGNOSIS — O0990 Supervision of high risk pregnancy, unspecified, unspecified trimester: Secondary | ICD-10-CM | POA: Insufficient documentation

## 2020-03-02 DIAGNOSIS — O365990 Maternal care for other known or suspected poor fetal growth, unspecified trimester, not applicable or unspecified: Secondary | ICD-10-CM | POA: Diagnosis not present

## 2020-03-02 DIAGNOSIS — Z3480 Encounter for supervision of other normal pregnancy, unspecified trimester: Secondary | ICD-10-CM | POA: Insufficient documentation

## 2020-03-02 NOTE — Procedures (Signed)
Tamara Vasquez 12-07-1995 [redacted]w[redacted]d  Fetus A Non-Stress Test Interpretation for 03/02/20  Indication: Insuff. PNC  Fetal Heart Rate A Mode: External Baseline Rate (A): 125 bpm Variability: Moderate Accelerations: 15 x 15 Decelerations: None Multiple birth?: No  Uterine Activity Mode: Palpation, Toco Contraction Frequency (min): x1 Contraction Duration (sec): 50 Contraction Quality: Mild Resting Tone Palpated: Relaxed Resting Time: Adequate  Interpretation (Fetal Testing) Nonstress Test Interpretation: Reactive Comments: Reviewed tracing with Dr. Judeth Cornfield

## 2020-03-03 ENCOUNTER — Ambulatory Visit (INDEPENDENT_AMBULATORY_CARE_PROVIDER_SITE_OTHER): Payer: Medicaid Other | Admitting: Obstetrics & Gynecology

## 2020-03-03 VITALS — BP 115/77 | HR 108 | Wt 178.8 lb

## 2020-03-03 DIAGNOSIS — O365930 Maternal care for other known or suspected poor fetal growth, third trimester, not applicable or unspecified: Secondary | ICD-10-CM

## 2020-03-03 DIAGNOSIS — Z3480 Encounter for supervision of other normal pregnancy, unspecified trimester: Secondary | ICD-10-CM

## 2020-03-03 NOTE — Patient Instructions (Signed)

## 2020-03-03 NOTE — Progress Notes (Signed)
   PRENATAL VISIT NOTE  Subjective:  Tamara Vasquez is a 24 y.o. G3P2002 at [redacted]w[redacted]d being seen today for ongoing prenatal care.  She is currently monitored for the following issues for this high-risk pregnancy and has Supervision of other normal pregnancy, antepartum and IUGR (intrauterine growth restriction) affecting care of mother on their problem list.  Patient reports occasional contractions.  Contractions: Irregular. Vag. Bleeding: None.  Movement: Present. Denies leaking of fluid.   The following portions of the patient's history were reviewed and updated as appropriate: allergies, current medications, past family history, past medical history, past social history, past surgical history and problem list.   Objective:   Vitals:   03/03/20 1605  BP: 115/77  Pulse: (!) 108  Weight: 178 lb 12.8 oz (81.1 kg)    Fetal Status: Fetal Heart Rate (bpm): 130   Movement: Present  Presentation: Vertex  General:  Alert, oriented and cooperative. Patient is in no acute distress.  Skin: Skin is warm and dry. No rash noted.   Cardiovascular: Normal heart rate noted  Respiratory: Normal respiratory effort, no problems with respiration noted  Abdomen: Soft, gravid, appropriate for gestational age.  Pain/Pressure: Absent     Pelvic: Cervical exam performed in the presence of a chaperone Dilation: 2.5 Effacement (%): 40 Station: -3  Extremities: Normal range of motion.  Edema: None  Mental Status: Normal mood and affect. Normal behavior. Normal judgment and thought content.   Assessment and Plan:  Pregnancy: G3P2002 at [redacted]w[redacted]d 1. Supervision of other normal pregnancy, antepartum   2. Poor fetal growth affecting management of mother in third trimester, single or unspecified fetus 25%ile on [place]  Term labor symptoms and general obstetric precautions including but not limited to vaginal bleeding, contractions, leaking of fluid and fetal movement were reviewed in detail with the patient. Please refer to  After Visit Summary for other counseling recommendations.   Return in about 1 week (around 03/10/2020).  Future Appointments  Date Time Provider Department Center  03/10/2020 10:15 AM Brock Bad, MD [facility] None    Scheryl Darter, MD

## 2020-03-03 NOTE — Progress Notes (Signed)
Pt is here for ROB [redacted]w[redacted]d.

## 2020-03-10 ENCOUNTER — Telehealth (HOSPITAL_COMMUNITY): Payer: Self-pay | Admitting: *Deleted

## 2020-03-10 ENCOUNTER — Other Ambulatory Visit: Payer: Self-pay

## 2020-03-10 ENCOUNTER — Ambulatory Visit (INDEPENDENT_AMBULATORY_CARE_PROVIDER_SITE_OTHER): Payer: Medicaid Other | Admitting: Obstetrics

## 2020-03-10 ENCOUNTER — Encounter: Payer: Self-pay | Admitting: Obstetrics

## 2020-03-10 VITALS — BP 111/74 | HR 91 | Wt 180.0 lb

## 2020-03-10 DIAGNOSIS — Z3A39 39 weeks gestation of pregnancy: Secondary | ICD-10-CM

## 2020-03-10 DIAGNOSIS — O365930 Maternal care for other known or suspected poor fetal growth, third trimester, not applicable or unspecified: Secondary | ICD-10-CM

## 2020-03-10 DIAGNOSIS — O0990 Supervision of high risk pregnancy, unspecified, unspecified trimester: Secondary | ICD-10-CM

## 2020-03-10 NOTE — Progress Notes (Signed)
Pt presents for ROB requests cx check today.  

## 2020-03-10 NOTE — Telephone Encounter (Signed)
Preadmission screen  

## 2020-03-10 NOTE — Progress Notes (Signed)
Subjective:  Tamara Vasquez is a 24 y.o. G3P2002 at [redacted]w[redacted]d being seen today for ongoing prenatal care.  She is currently monitored for the following issues for this high-risk pregnancy and has Supervision of other normal pregnancy, antepartum and IUGR (intrauterine growth restriction) affecting care of mother on their problem list.  Patient reports no complaints.  Contractions: Irregular. Vag. Bleeding: None.  Movement: Present. Denies leaking of fluid.   The following portions of the patient's history were reviewed and updated as appropriate: allergies, current medications, past family history, past medical history, past social history, past surgical history and problem list. Problem list updated.  Objective:   Vitals:   03/10/20 1033  BP: 111/74  Pulse: 91  Weight: 180 lb (81.6 kg)    Fetal Status:     Movement: Present     General:  Alert, oriented and cooperative. Patient is in no acute distress.  Skin: Skin is warm and dry. No rash noted.   Cardiovascular: Normal heart rate noted  Respiratory: Normal respiratory effort, no problems with respiration noted  Abdomen: Soft, gravid, appropriate for gestational age. Pain/Pressure: Present     Pelvic:  Cervical exam performed      3cm / 50% / -3 / Vtx  Extremities: Normal range of motion.  Edema: None  Mental Status: Normal mood and affect. Normal behavior. Normal judgment and thought content.   Urinalysis:      Assessment and Plan:  Pregnancy: G3P2002 at [redacted]w[redacted]d  1. Supervision of high risk pregnancy, antepartum  2. Poor fetal growth affecting management of mother in third trimester, single or unspecified fetus - [place] and BPP with Dopplers all normal on 03-02-20 - IOL at 40.5 weeks   Term labor symptoms and general obstetric precautions including but not limited to vaginal bleeding, contractions, leaking of fluid and fetal movement were reviewed in detail with the patient. Please refer to After Visit Summary for other counseling  recommendations.   Return in about 4 weeks (around 04/07/2020) for postpartum visit.   Brock Bad, MD  03/10/20

## 2020-03-11 ENCOUNTER — Ambulatory Visit: Payer: Medicaid Other

## 2020-03-11 ENCOUNTER — Other Ambulatory Visit: Payer: Self-pay | Admitting: Obstetrics

## 2020-03-12 ENCOUNTER — Encounter (HOSPITAL_COMMUNITY): Payer: Self-pay

## 2020-03-12 ENCOUNTER — Inpatient Hospital Stay (HOSPITAL_COMMUNITY)
Admission: EM | Admit: 2020-03-12 | Discharge: 2020-03-14 | DRG: 805 | Disposition: A | Discharge status: Home / Self Care | Payer: Medicaid Other | Attending: Obstetrics and Gynecology | Admitting: Obstetrics and Gynecology

## 2020-03-12 DIAGNOSIS — O365930 Maternal care for other known or suspected poor fetal growth, third trimester, not applicable or unspecified: Principal | ICD-10-CM | POA: Diagnosis present

## 2020-03-12 DIAGNOSIS — O26893 Other specified pregnancy related conditions, third trimester: Secondary | ICD-10-CM | POA: Diagnosis present

## 2020-03-12 DIAGNOSIS — O365990 Maternal care for other known or suspected poor fetal growth, unspecified trimester, not applicable or unspecified: Secondary | ICD-10-CM | POA: Diagnosis present

## 2020-03-12 DIAGNOSIS — U071 COVID-19: Secondary | ICD-10-CM | POA: Diagnosis present

## 2020-03-12 DIAGNOSIS — Z3480 Encounter for supervision of other normal pregnancy, unspecified trimester: Secondary | ICD-10-CM

## 2020-03-12 DIAGNOSIS — O9852 Other viral diseases complicating childbirth: Secondary | ICD-10-CM | POA: Diagnosis present

## 2020-03-12 DIAGNOSIS — O43123 Velamentous insertion of umbilical cord, third trimester: Secondary | ICD-10-CM | POA: Diagnosis present

## 2020-03-12 DIAGNOSIS — Z3A40 40 weeks gestation of pregnancy: Secondary | ICD-10-CM

## 2020-03-12 LAB — CBC WITH DIFFERENTIAL/PLATELET
Abs Immature Granulocytes: 0.08 10*3/uL — ABNORMAL HIGH (ref 0.00–0.07)
Basophils Absolute: 0 10*3/uL (ref 0.0–0.1)
Basophils Relative: 0 %
Eosinophils Absolute: 0 10*3/uL (ref 0.0–0.5)
Eosinophils Relative: 0 %
HCT: 36.7 % (ref 36.0–46.0)
Hemoglobin: 11.7 g/dL — ABNORMAL LOW (ref 12.0–15.0)
Immature Granulocytes: 1 %
Lymphocytes Relative: 16 %
Lymphs Abs: 1.9 10*3/uL (ref 0.7–4.0)
MCH: 28 pg (ref 26.0–34.0)
MCHC: 31.9 g/dL (ref 30.0–36.0)
MCV: 87.8 fL (ref 80.0–100.0)
Monocytes Absolute: 0.6 10*3/uL (ref 0.1–1.0)
Monocytes Relative: 5 %
Neutro Abs: 9.1 10*3/uL — ABNORMAL HIGH (ref 1.7–7.7)
Neutrophils Relative %: 78 %
Platelets: 334 10*3/uL (ref 150–400)
RBC: 4.18 MIL/uL (ref 3.87–5.11)
RDW: 14.5 % (ref 11.5–15.5)
WBC: 11.6 10*3/uL — ABNORMAL HIGH (ref 4.0–10.5)
nRBC: 0 % (ref 0.0–0.2)

## 2020-03-12 LAB — TYPE AND SCREEN
ABO/RH(D): O POS
ABO/RH(D): O POS
Antibody Screen: NEGATIVE
Antibody Screen: NEGATIVE

## 2020-03-12 LAB — SARS CORONAVIRUS 2 BY RT PCR (HOSPITAL ORDER, PERFORMED IN ~~LOC~~ HOSPITAL LAB): SARS Coronavirus 2: POSITIVE — AB

## 2020-03-12 LAB — RPR: RPR Ser Ql: NONREACTIVE

## 2020-03-12 MED ORDER — WITCH HAZEL-GLYCERIN EX PADS: 1.0000 "application " | MEDICATED_PAD | CUTANEOUS | Status: DC | PRN

## 2020-03-12 MED ORDER — PRENATAL MULTIVITAMIN CH
1.0000 | ORAL_TABLET | Freq: Every day | ORAL | Status: DC
  Administered 2020-03-12 – 2020-03-14 (×3): 1 via ORAL
  Filled 2020-03-12 (×3): qty 1

## 2020-03-12 MED ORDER — COCONUT OIL OIL: 1.0000 "application " | TOPICAL_OIL | Status: DC | PRN

## 2020-03-12 MED ORDER — ZOLPIDEM TARTRATE 5 MG PO TABS: 5.0000 mg | ORAL_TABLET | Freq: Every evening | ORAL | Status: DC | PRN

## 2020-03-12 MED ORDER — ACETAMINOPHEN 325 MG PO TABS
650.0000 mg | ORAL_TABLET | ORAL | Status: DC | PRN
  Administered 2020-03-12: 650 mg via ORAL
  Filled 2020-03-12: qty 2

## 2020-03-12 MED ORDER — TETANUS-DIPHTH-ACELL PERTUSSIS 5-2.5-18.5 LF-MCG/0.5 IM SUSP: 0.5000 mL | Freq: Once | INTRAMUSCULAR | Status: DC

## 2020-03-12 MED ORDER — SIMETHICONE 80 MG PO CHEW: 80.0000 mg | CHEWABLE_TABLET | ORAL | Status: DC | PRN

## 2020-03-12 MED ORDER — IBUPROFEN 600 MG PO TABS
600.0000 mg | ORAL_TABLET | Freq: Four times a day (QID) | ORAL | Status: DC
  Administered 2020-03-12 – 2020-03-14 (×10): 600 mg via ORAL
  Filled 2020-03-12 (×10): qty 1

## 2020-03-12 MED ORDER — MISOPROSTOL 200 MCG PO TABS
600.0000 ug | ORAL_TABLET | Freq: Once | ORAL | Status: AC
  Administered 2020-03-12: 600 ug via ORAL
  Filled 2020-03-12: qty 3

## 2020-03-12 MED ORDER — ONDANSETRON HCL 4 MG/2ML IJ SOLN: 4.0000 mg | INTRAMUSCULAR | Status: DC | PRN

## 2020-03-12 MED ORDER — DIPHENHYDRAMINE HCL 25 MG PO CAPS: 25.0000 mg | ORAL_CAPSULE | Freq: Four times a day (QID) | ORAL | Status: DC | PRN

## 2020-03-12 MED ORDER — SODIUM CHLORIDE 0.9 % IV SOLN
2.5000 [IU]/h | INTRAVENOUS | Status: DC
  Administered 2020-03-12: 2.5 [IU]/h via INTRAVENOUS
  Filled 2020-03-12 (×2): qty 3

## 2020-03-12 MED ORDER — SENNOSIDES-DOCUSATE SODIUM 8.6-50 MG PO TABS: 2.0000 | ORAL_TABLET | ORAL | Status: DC

## 2020-03-12 MED ORDER — OXYCODONE HCL 5 MG PO TABS: 5.0000 mg | ORAL_TABLET | Freq: Four times a day (QID) | ORAL | Status: DC | PRN

## 2020-03-12 MED ORDER — BENZOCAINE-MENTHOL 20-0.5 % EX AERO: 1.0000 "application " | INHALATION_SPRAY | CUTANEOUS | Status: DC | PRN

## 2020-03-12 MED ORDER — DIBUCAINE (PERIANAL) 1 % EX OINT: 1.0000 "application " | TOPICAL_OINTMENT | CUTANEOUS | Status: DC | PRN

## 2020-03-12 MED ORDER — ONDANSETRON HCL 4 MG PO TABS: 4.0000 mg | ORAL_TABLET | ORAL | Status: DC | PRN

## 2020-03-12 NOTE — ED Notes (Signed)
Fetal HR monitor applied.

## 2020-03-12 NOTE — Progress Notes (Signed)
Pt. Cervix 10cm with bulging amniotic bag. OBGYN provider dr. Emelda Fear notified of imminent delivery. Pt. Feeling rectal pressure. Pt. Set up for delivery.

## 2020-03-12 NOTE — ED Triage Notes (Signed)
PT presents to ED with contractions 5 minutes apart. 3rd pregnancy. 40 weeks today. Pt states her water has not broke.

## 2020-03-12 NOTE — H&P (Signed)
Obstetric History and Physical  Tamara Vasquez is a 24 y.o. G3P2002 with IUP at [redacted]w[redacted]d presenting for active labor to [facility] .  Patient states she has been having  regular, every 3 minutes contractions, none vaginal bleeding, intact membranes, with active fetal movement.    Prenatal Course Source of Care: [facility]  with onset of care at early first trimester weeks Pregnancy complications or risks: Patient Active Problem List   Diagnosis Date Noted  . IUGR (intrauterine growth restriction) affecting care of mother 01/04/2020  . Supervision of other normal pregnancy, antepartum 10/22/2019   She undecided She desires Depo-Provera for postpartum contraception.   Prenatal labs and studies: ABO, Rh: O/Positive/-- (03/26 1034) Antibody: Negative (03/26 1034) Rubella: 2.90 (03/26 1034) RPR: Non Reactive (06/07 1009)  HBsAg: Negative (03/26 1034)  HIV: Non Reactive (06/07 1009)   TIW:PYKDXIPJ/-- (07/22 1133) 1 hr Glucola  normal Genetic screening normal Anatomy US abnormal with Fetal growth restriction  Prenatal Transfer Tool  Maternal Diabetes: No Genetic Screening: Normal Maternal Ultrasounds/Referrals: IUGR Fetal Ultrasounds or other Referrals:  Referred to Materal Fetal Medicine  Maternal Substance Abuse:  No Significant Maternal Medications:  None Significant Maternal Lab Results: Group B Strep negative  Past Medical History:  Diagnosis Date  . Medical history non-contributory     Past Surgical History:  Procedure Laterality Date  . NO PAST SURGERIES      OB History  Gravida Para Term Preterm AB Living  3 2 2     2   SAB TAB Ectopic Multiple Live Births               # Outcome Date GA Lbr Len/2nd Weight Sex Delivery Anes PTL Lv  3 Current           2 Term 04/27/19    M Vag-Spont     1 Term 06/05/17    F Vag-Spont       Social History   Socioeconomic History  . Marital status: Single    Spouse name: Not on file  . Number of children: 2  . Years of  education: Not on file  . Highest education level: Not on file  Occupational History  . Occupation: UNEMPLOYED  Tobacco Use  . Smoking status: Never Smoker  . Smokeless tobacco: Never Used  Vaping Use  . Vaping Use: Never used  Substance and Sexual Activity  . Alcohol use: Not Currently  . Drug use: Never  . Sexual activity: Yes    Birth control/protection: None  Other Topics Concern  . Not on file  Social History Narrative  . Not on file   Social Determinants of Health   Financial Resource Strain:   . Difficulty of Paying Living Expenses:   Food Insecurity:   . Worried About 13/07/18 in the Last Year:   . Programme researcher, broadcasting/film/video in the Last Year:   Transportation Needs:   . Barista (Medical):   Freight forwarder Lack of Transportation (Non-Medical):   Physical Activity:   . Days of Exercise per Week:   . Minutes of Exercise per Session:   Stress:   . Feeling of Stress :   Social Connections:   . Frequency of Communication with Friends and Family:   . Frequency of Social Gatherings with Friends and Family:   . Attends Religious Services:   . Active Member of Clubs or Organizations:   . Attends [employer] Meetings:   Banker Marital Status:  No family history on file.  (Not in a hospital admission)   Allergies  Allergen Reactions  . Coconut Flavor     Review of Systems: Negative except for what is mentioned in HPI.  Physical Exam: BP 130/72   Pulse (!) 116   Temp 98.9 F (37.2 C) (Oral)   Resp 20   Ht 5\' 1"  (1.549 m)   Wt 80.7 kg   LMP 07/31/2019 (Within Weeks)   SpO2 98%   BMI 33.63 kg/m  CONSTITUTIONAL: Well-developed, well-nourished female in no acute distress.  HENT:  Normocephalic, atraumatic, External right and left ear normal. Oropharynx is clear and moist EYES: Conjunctivae and EOM are normal. Pupils are equal, round, and reactive to light. No scleral icterus.  NECK: Normal range of motion, supple, no masses SKIN: Skin is warm and  dry. No rash noted. Not diaphoretic. No erythema. No pallor. NEUROLOGIC: Alert and oriented to person, place, and time. Normal reflexes, muscle tone coordination. No cranial nerve deficit noted. PSYCHIATRIC: Normal mood and affect. Normal behavior. Normal judgment and thought content. CARDIOVASCULAR: Normal heart rate noted, regular rhythm RESPIRATORY: Effort and breath sounds normal, no problems with respiration noted ABDOMEN: Soft, nontender, nondistended, gravid. MUSCULOSKELETAL: Normal range of motion. No edema and no tenderness. 2+ distal pulses.  Cervical Exam: Dilatation 10cm   Effacement 100%   Station 0   Presentation: cephalic FHT:  Baseline rate 145 bpm   Variability moderate  Accelerations present   Decelerations none Contractions: Every 3-4 mins   Pertinent Labs/Studies:   Results for orders placed or performed during the hospital encounter of 03/12/20 (from the past 24 hour(s))  CBC with Differential     Status: Abnormal   Collection Time: 03/12/20  3:11 AM  Result Value Ref Range   WBC 11.6 (H) 4.0 - 10.5 K/uL   RBC 4.18 3.87 - 5.11 MIL/uL   Hemoglobin 11.7 (L) 12.0 - 15.0 g/dL   HCT 03/14/20 36 - 46 %   MCV 87.8 80.0 - 100.0 fL   MCH 28.0 26.0 - 34.0 pg   MCHC 31.9 30.0 - 36.0 g/dL   RDW 91.4 78.2 - 95.6 %   Platelets 334 150 - 400 K/uL   nRBC 0.0 0.0 - 0.2 %   Neutrophils Relative % 78 %   Neutro Abs 9.1 (H) 1.7 - 7.7 K/uL   Lymphocytes Relative 16 %   Lymphs Abs 1.9 0.7 - 4.0 K/uL   Monocytes Relative 5 %   Monocytes Absolute 0.6 0 - 1 K/uL   Eosinophils Relative 0 %   Eosinophils Absolute 0.0 0 - 0 K/uL   Basophils Relative 0 %   Basophils Absolute 0.0 0 - 0 K/uL   Immature Granulocytes 1 %   Abs Immature Granulocytes 0.08 (H) 0.00 - 0.07 K/uL    Assessment : Tamara Vasquez is a 24 y.o. G3P2002 at [redacted]w[redacted]d being admitted for labor.imminent delivery at [facility], unable to transfer prior to delivery  Plan: Labor: Expectant management.  Induction/Augmentation as  needed, per protocols FWB: Reassuring fetal heart tracing.   GBS negativeNegative/-- (07/22 1133)  Delivery plan: Hopeful for vaginal delivery

## 2020-03-12 NOTE — Progress Notes (Signed)
Pt. Reports to emergency department for loss of mucus plug and ctx since ~2345. Pt. G3P2 and pregnancy is complicated by IUGR, which is monitored by MFM. Pt. To be induced on Wednesday for term. Pt. Denies LOF or vaginal bleeding. Pt. Endorses fetal movement. Previous vaginal deliveries. Ctx every 2 minutes. Pt. Placed on EFM at this time.

## 2020-03-12 NOTE — ED Notes (Signed)
PA at bedside.

## 2020-03-12 NOTE — ED Notes (Signed)
Rapid OB nurse at bedside 

## 2020-03-12 NOTE — Progress Notes (Signed)
Updated about patient being COVID + at 0720, informed charge nurse of the change in patient status right away. Began the change in rooms. Finished putting patient in new room by 0800. Informed patient of her status and when FOB comes back that he will have to stay in room entire stay. Patient understands updated information, safely moved her and newborn.

## 2020-03-12 NOTE — Discharge Summary (Signed)
.   Obstetric Discharge Summary Reason for Admission: onset of labor Prenatal Procedures: none COVID POSITIVE PATIENT. Intrapartum Procedures: spontaneous vaginal delivery Postpartum Procedures: none Complications-Operative and Postpartum: none and COVID POSITIVE SCREEN TEST ON ADMIT   Delivery Note At 3:23 AM a viable and healthy female was delivered via Vaginal, Spontaneous (Presentation: Left Occiput Anterior).  APGAR: 9, 9; weight 6 lb 11.4 oz (3045 g).   Placenta status: Spontaneous, Intact.  Cord: 3 vessels with the following complications: Velamentous.  Cord pH: none drawn  Anesthesia: None Episiotomy: None Lacerations: None Suture Repair:  Est. Blood Loss (mL): 50  Mom to postpartum.  Baby to Couplet care / Skin to Skin.  Tilda Burrow 03/12/2020, 10:07 AM     H/H: Lab Results  Component Value Date/Time   HGB 11.7 (L) 03/12/2020 03:11 AM   HGB 11.1 01/04/2020 10:09 AM   HCT 36.7 03/12/2020 03:11 AM   HCT 33.6 (L) 01/04/2020 10:09 AM      Discharge Diagnoses: Term Pregnancy-delivered  Discharge Information: VELAMENTOUS CORD INSERTION , vaginal delivery Date: 02/08/2011 Activity: pelvic rest Diet: routine Medications: PNV and Ibuprofen Breast feeding:  Yes Condition: stable Instructions: refer to practice specific booklet Discharge to: home   Tilda Burrow 03/12/2020,10:07 AM

## 2020-03-12 NOTE — ED Provider Notes (Signed)
[facility] DEPT Provider Note   CSN: [id number] Arrival date & time: 03/12/20  3419     History Chief Complaint  Patient presents with  . Laboring    Tamara Vasquez is a 24 y.o. female.  The history is provided by the patient and medical records.    24 y.o. G3P2 [redacted] wks gestation with EDD 03/12/20, presenting to the ED with labor.  States she felt some loss of fluids at home, seems to have lost her mucous plug.  Does not feel like her water broke yet. Denies vaginal bleeding. She reports contractions every few minutes or see, believes 5 or less minutes apart.  She denies complications thus far in pregnancy (however notes indicate IUGR).  Prior deliveries all natural, SVD.  States last visit with OB was 03/10/20, noted to be 3cm, 50% effaced, -3 station, vertex.  Past Medical History:  Diagnosis Date  . Medical history non-contributory     Patient Active Problem List   Diagnosis Date Noted  . IUGR (intrauterine growth restriction) affecting care of mother 01/04/2020  . Supervision of other normal pregnancy, antepartum 10/22/2019    Past Surgical History:  Procedure Laterality Date  . NO PAST SURGERIES       OB History    Gravida  3   Para  2   Term  2   Preterm      AB      Living  2     SAB      TAB      Ectopic      Multiple      Live Births              No family history on file.  Social History   Tobacco Use  . Smoking status: Never Smoker  . Smokeless tobacco: Never Used  Vaping Use  . Vaping Use: Never used  Substance Use Topics  . Alcohol use: Not Currently  . Drug use: Never    Home Medications Prior to Admission medications   Medication Sig Start Date End Date Taking? Authorizing Provider  Blood Pressure Monitoring (BLOOD PRESSURE KIT) DEVI 1 kit by Does not apply route once a week. Check Blood Pressure regularly and record readings into the Babyscripts App.  Large Cuff.  DX O90.0 10/23/19   Chancy Milroy, MD  Elastic Bandages & Supports (COMFORT FIT MATERNITY SUPP SM) MISC 1 Units by Does not apply route daily as needed. Patient not taking: Reported on 02/25/2020 09/22/19   Lajean Manes, CNM  Elastic Bandages & Supports MISC 1 Device by Does not apply route daily. Use as directed for pelvic pressure and pain. Patient not taking: Reported on 02/25/2020 10/23/19   Chancy Milroy, MD  Prenatal Vit-Fe Fumarate-FA (PREPLUS) 27-1 MG TABS Take 1 tablet by mouth daily. 09/22/19   Lajean Manes, CNM    Allergies    Coconut flavor  Review of Systems   Review of Systems  Genitourinary:       Labor  All other systems reviewed and are negative.   Physical Exam Updated Vital Signs BP 131/85 (BP Location: Right Arm)   Pulse 97   Temp 98.9 F (37.2 C) (Oral)   Resp 20   Ht _0  (1.549 m)   Wt 80.7 kg   LMP 07/31/2019 (Within Weeks)   SpO2 98%   BMI 33.63 kg/m   Physical Exam Vitals and nursing note reviewed.  Constitutional:  Appearance: She is well-developed.  HENT:     Head: Normocephalic and atraumatic.  Eyes:     Conjunctiva/sclera: Conjunctivae normal.     Pupils: Pupils are equal, round, and reactive to light.  Cardiovascular:     Rate and Rhythm: Normal rate and regular rhythm.     Heart sounds: Normal heart sounds.  Pulmonary:     Effort: Pulmonary effort is normal.     Breath sounds: Normal breath sounds. No stridor. No wheezing.  Abdominal:     General: Bowel sounds are normal.     Palpations: Abdomen is soft.     Tenderness: There is no abdominal tenderness. There is no rebound.     Comments: Gravid abdomen  Musculoskeletal:        General: Normal range of motion.     Cervical back: Normal range of motion.  Skin:    General: Skin is warm and dry.  Neurological:     Mental Status: She is alert and oriented to person, place, and time.     ED Results / Procedures / Treatments   Labs (all labs ordered are listed, but only abnormal results  are displayed) Labs Reviewed  CBC WITH DIFFERENTIAL/PLATELET - Abnormal; Notable for the following components:      Result Value   WBC 11.6 (*)    Hemoglobin 11.7 (*)    Neutro Abs 9.1 (*)    Abs Immature Granulocytes 0.08 (*)    All other components within normal limits  SARS CORONAVIRUS 2 BY RT PCR (HOSPITAL ORDER, [facility])  RPR  TYPE AND SCREEN  RH IG WORKUP (INCLUDES ABO/RH)  ABO/RH    EKG None  Radiology No results found.  Procedures Procedures (including critical care time)  CRITICAL CARE Performed by: Larene Pickett   Total critical care time: 45 minutes  Critical care time was exclusive of separately billable procedures and treating other patients.  Critical care was necessary to treat or prevent imminent or life-threatening deterioration.  Critical care was time spent personally by me on the following activities: development of treatment plan with patient and/or surrogate as well as nursing, discussions with consultants, evaluation of patient's response to treatment, examination of patient, obtaining history from patient or surrogate, ordering and performing treatments and interventions, ordering and review of laboratory studies, ordering and review of radiographic studies, pulse oximetry and re-evaluation of patient's condition.   Medications Ordered in ED Medications - No data to display  ED Course  I have reviewed the triage vital signs and the nursing notes.  Pertinent labs & imaging results that were available during my care of the patient were reviewed by me and considered in my medical decision making (see chart for details).    MDM Rules/Calculators/A&P  24 y.o. G3P2 40w gestation with EDD today, presenting to the ED in labor.  Seems to have lost her mucous plug but did not feel her water break.  States contractions occurring 5 or less minutes apart.  At appt 03/10/20 was 3cm, 50% effaced, -3 station, vertex.  Patient  hemodynamically stable on arrival, does appear to be having contractions.  Currently on toco, FHR 110's-110's, mother feeling + fetal movement.  [facility] has been paged and will come assess.    [facility] has assessed-- eminent delivery as she is currently 10cm.  Prior deliveries were very rapid (1-2 minutes).  OB attending, Dr. Glo Herring on the way.  3:38 AM Baby boy delivered here by myself and Dr.  Ferguson.  No immediate complications.  Mother comfortable and resting with baby currently, preparing for transfer to [facility] pavilion.  4:55 AM carelink at bedside for mother at baby.  Remains HD stable.  Final Clinical Impression(s) / ED Diagnoses Final diagnoses:  SVD (spontaneous vaginal delivery)    Rx / DC Orders ED Discharge Orders    None       Larene Pickett, PA-C 03/12/20 0456    Margette Fast, MD 03/12/20 [phone]

## 2020-03-12 NOTE — Progress Notes (Signed)
Patient delivered at [facility], she labored at home and upon arrival she was 10 cm dilated and healthcare staff decided to hold patient until she delivered. After delivery and before results were seen or discussed patient was transported to [facility] without healthcare staff wearing proper PPE for a COVID positive result. After staff at [facility] and [facility] took over care, proper PPE for COVID positive were not wore as results had not been posted or passed along. Updates were made after day shift arrived to day shift nurses. Proper actions were taken to protect patients and healthcare staff from further harm.

## 2020-03-12 NOTE — Progress Notes (Signed)
Report given to carelink RN Britta Mccreedy and mother/baby charge RN at this time. Pt. To be transferred to mother baby room 510 by carelink. Baby and mother transported separately.

## 2020-03-13 MED ORDER — MEDROXYPROGESTERONE ACETATE 150 MG/ML IM SUSP
150.0000 mg | Freq: Once | INTRAMUSCULAR | Status: AC
  Administered 2020-03-14: 150 mg via INTRAMUSCULAR
  Filled 2020-03-13: qty 1

## 2020-03-13 NOTE — Lactation Note (Signed)
This note was copied from a baby's chart. Lactation Consultation Note  Patient Name: Tamara Vasquez [id number] Date: 03/13/2020  Referral from RN.  Mothers request also to see lactation.  Infant not latching well so mother has been pumping.  Mom trying to feed on arrival and infant just on the tip of nipple.   Assist in repositioning infant to get a bigger bite of breast and he just holds in mouth.  Inquired about nipple shield use.  Mom reports he dosent like it that he does better without it.  Mom has abrasions on her areola at the base of the nipples.  Blister on right breast at base about 12 oclock on areola. Much worse on the right breast.  Mom has been using 24 mm flanges and from visual assessment appears 24 should be appropriate.  Discussed flange fit.  Assisted mom in trying 21 flange on right breast only.  No areolar tissue being pulled inside of flange and mom reports comfort.  Then used 24 mm flange on right breast.  Mom reports they really dont feel any different however mom felt got more milk with 21 mm flange.  Slight areolar tissue getting pulled inside 24 however nothing to explain damage on areolar tissue.   Mom not using hands free bra and keeping settings pretty low.  Discussed mom to pay close attention to pumping next few sessions. Reviewed how nipple sgould flow down isside flange and not rub on the sides.  Discussed with mom trying 21 on right and 24 on left however both nipples appear sized pretty equal. RN attempted to measure nipples prior toLC entering room and she reports she got 15 mm one way and 2 mm the other way. Showed mom how to hand express and rub colostrum on areolas.  Urged to do this as often as possible.  Let it air dry and then add the comfort gels. Urged mom to call lactation services as needed  Maternal Data    Feeding Feeding Type: Bottle Fed - Formula Nipple Type: Slow - flow  LATCH Score                   Interventions    Lactation Tools  Discussed/Used     Consult Status      Tamara Vasquez 03/13/2020, 11:25 PM

## 2020-03-13 NOTE — Lactation Note (Signed)
This note was copied from a baby's chart. Lactation Consultation Note  Patient Name: Tamara Vasquez [id number] Date: 03/13/2020 Reason for consult: Initial assessment wt loss.   Infant is 32 hours old and is at 6% wt loss. LC was ask to follow up with patient.  Mother is a P3. She reports that she breastfed her other two children for two months each.   Observed that mother has very pink tender nipples on arrival..  Mother hand expresses large drops of colostrum .   Infant very sleepy when attempting to rouse with STS. Infant refused breast the first several attempts.  Infant latched on only on the tip of mothers nipple. Very difficult latch.  Mother was fit with a #24 nipple shield infant showing no interest in suckling.  Infant was given 5 ml of formula through the NS with a curved tip syringe . Infant took feeding  slowly.   Father formula fed infant a bottle.   Mother was sat up with a DEBP, mother advised in cleaning pump parts and suggested that she pump every 2-3 hours., for 15 mins She is active with WIC .  WIC referral was sent to [facility].   Plan of Care : Breastfeed infant with feeding cues using Nipple shield.  Supplement infant with ebm/formula, according to supplemental guidelines. Pump using a DEBP after each feeding for 15-20 mins.   Mother to continue to cue base feed infant and feed at least 8-12 times or more in 24 hours and advised to allow for cluster feeding infant as needed.   Mother to continue to due STS. Mother is aware of available LC services at [facility], BFSG'S, OP Dept, and phone # for questions or concerns about breastfeeding.  Mother receptive to all teaching and plan of care.     Maternal Data Has patient been taught Hand Expression?: Yes Does the patient have breastfeeding experience prior to this delivery?: Yes  Feeding Feeding Type: Breast Fed  LATCH Score                   Interventions Interventions: Breast feeding basics  reviewed;Assisted with latch;Skin to skin;Hand express;Breast compression;Adjust position;Support pillows;Position options;Hand pump;DEBP  Lactation Tools Discussed/Used Tools: Nipple Shields Nipple shield size: 24 WIC Program: Yes Pump Review: Setup, frequency, and cleaning;Milk Storage Initiated by:: Itzel Lowrimore rn,ibclc Date initiated:: 03/13/20   Consult Status Consult Status: Follow-up Date: 03/14/20 Follow-up type: In-patient    Stevan Born Aker [facility] 03/13/2020, 2:45 PM

## 2020-03-13 NOTE — Discharge Summary (Signed)
Postpartum Discharge Summary    Patient Name: Tamara Vasquez DOB: November 14, 1995 MRN: [id number]  Date of admission: 03/12/2020 Delivery date:03/12/2020  Delivering provider: Jonnie Kind  Date of discharge: 03/14/2020  Admitting diagnosis: SVD (spontaneous vaginal delivery) [O80] Supervision of other normal pregnancy, antepartum [Z34.80] Vaginal delivery [O80] Intrauterine pregnancy: [redacted]w[redacted]d    Secondary diagnosis:  Principal Problem:   Vaginal delivery Active Problems:   IUGR (intrauterine growth restriction) affecting care of mother   COVID-19 virus infection  [facility]    Discharge diagnosis: Term Pregnancy Delivered                                              Post partum procedures:NA Augmentation: NA Complications: None  Hospital course: Onset of Labor With Vaginal Delivery      24y.o. yo G3P3003 [id number]. She was completely dilated at initial assessment, [facility] Patient had an uncomplicated labor course as follows:  Membrane Rupture Time/Date: 3:19 AM ,03/12/2020   Delivery Method:Vaginal, Spontaneous  2 contractions later. Episiotomy: None  Lacerations:  None    She was found on admission Covid testing to be Covid Positive. Patient had an uncomplicated postpartum course.  She is ambulating, tolerating a regular diet, passing flatus, and urinating well. Patient is discharged home in stable condition on 03/14/20.  Newborn Data: Birth date:03/12/2020  Birth time:3:23 AM  Gender:Female  Living status:Living  Apgars:9 ,9  Weight:3045 g   Magnesium Sulfate received: No BMZ received: No Rhophylac:No MMR:No T-DaP:Given prenatally Flu: N/A Transfusion:No  Physical exam  Vitals:   03/13/20 0618 03/13/20 1400 03/13/20 2015 03/14/20 0557  BP: 113/79 125/82 121/80 114/74  Pulse: (!) 52 70 62 (!) 57  Resp: _0 Temp:  97.9 F (36.6 C) 98.2 F (36.8 C) 98.5 F (36.9 C) 98.1 F (36.7 C)  TempSrc: Oral Oral Oral Oral  SpO2: 99% 99% 100% 98%  Weight:      Height:       General: alert, cooperative and no distress Lochia: appropriate Uterine Fundus: firm Incision: N/A DVT Evaluation: No evidence of DVT seen on physical exam. No cords or calf tenderness. No significant calf/ankle edema. Labs: Lab Results  Component Value Date   WBC 11.6 (H) 03/12/2020   HGB 11.7 (L) 03/12/2020   HCT 36.7 03/12/2020   MCV 87.8 03/12/2020   PLT 334 03/12/2020   CMP Latest Ref Rng & Units 11/06/2019  Glucose 70 - 99 mg/dL 80  BUN 6 - 20 mg/dL 14  Creatinine 0.44 - 1.00 mg/dL 0.76  Sodium 135 - 145 mmol/L 140  Potassium 3.5 - 5.1 mmol/L 3.6  Chloride 98 - 111 mmol/L 107  CO2 22 - 32 mmol/L 24  Calcium 8.9 - 10.3 mg/dL 10.5(H)  Total Protein 6.5 - 8.1 g/dL 7.0  Total Bilirubin 0.3 - 1.2 mg/dL 0.4  Alkaline Phos 38 - 126 U/L 53  AST 15 - 41 U/L 17  ALT 0 - 44 U/L 14   Edinburgh Score: Edinburgh Postnatal Depression Scale Screening Tool 03/12/2020  I have been able to laugh and see the funny side of things. 0  I have looked forward with enjoyment to things. 0  I have blamed  myself unnecessarily when things went wrong. 0  I have been anxious or worried for no good reason. 0  I have felt scared or panicky for no good reason. 0  Things have been getting on top of me. 0  I have been so unhappy that I have had difficulty sleeping. 0  I have felt sad or miserable. 0  I have been so unhappy that I have been crying. 0  The thought of harming myself has occurred to me. 0  Edinburgh Postnatal Depression Scale Total 0     After visit meds:  Allergies as of 03/14/2020      Reactions   Coconut Flavor       Medication List    STOP taking these medications   Comfort Fit Maternity Supp Sm Misc   Elastic Bandages & Supports Misc     TAKE these medications   acetaminophen 325 MG tablet Commonly known as:  Tylenol Take 2 tablets (650 mg total) by mouth every 6 (six) hours. What changed:   medication strength  how much to take  when to take this  reasons to take this   Blood Pressure Kit Devi 1 kit by Does not apply route once a week. Check Blood Pressure regularly and record readings into the Babyscripts App.  Large Cuff.  DX O90.0   coconut oil Oil Apply 1 application topically as needed (for nipple pain).   ibuprofen 600 MG tablet Commonly known as: ADVIL Take 1 tablet (600 mg total) by mouth every 8 (eight) hours as needed for moderate pain.   PrePLUS 27-1 MG Tabs Take 1 tablet by mouth daily.       Discharge home in stable condition Infant Feeding: Breast Infant Disposition:home with mother Discharge instruction: per After Visit Summary and Postpartum booklet. Activity: Advance as tolerated. Pelvic rest for 6 weeks.  Diet: routine diet Future Appointments: Future Appointments  Date Time Provider [place]  04/13/2020 10:15 AM Leftwich-Kirby, Kathie Dike, CNM [facility] None   Follow up Visit:  Please schedule this patient for a Virtual postpartum visit in 4 weeks with the following provider: Any provider. Additional Postpartum F/U:NA  High risk pregnancy complicated by: velamentous cord insertion Delivery mode:  Vaginal, Spontaneous  Anticipated Birth Control:  Depo  Randa Ngo, MD OB Fellow, Faculty Practice 03/14/2020 12:01 PM

## 2020-03-13 NOTE — Progress Notes (Signed)
Post Partum Day #1 Subjective: no complaints Patient denies symptoms of COVID-no cough, SOB, fever, muscle aches.  Is ambulating, eating Objective: Blood pressure 113/79, pulse (!) 52, temperature 97.9 F (36.6 C), temperature source Oral, resp. rate 18, height 5\' 1"  (1.549 m), weight 80.7 kg, last menstrual period 07/31/2019, SpO2 99 %, unknown if currently breastfeeding.  Physical Exam:  General: alert, cooperative and no distress Lochia: appropriate Uterine Fundus: not examined Incision: NA DVT Evaluation: NA  Recent Labs    03/12/20 0311  HGB 11.7*  HCT 36.7    Assessment/Plan: Plan for discharge tomorrow   LOS: 1 day   03/14/20 [facility] 03/13/2020, 12:53 PM

## 2020-03-14 ENCOUNTER — Other Ambulatory Visit (HOSPITAL_COMMUNITY): Payer: Medicaid Other

## 2020-03-14 DIAGNOSIS — U071 COVID-19: Secondary | ICD-10-CM

## 2020-03-14 MED ORDER — ACETAMINOPHEN 325 MG PO TABS: 650.0000 mg | ORAL_TABLET | Freq: Four times a day (QID) | ORAL | Status: DC

## 2020-03-14 MED ORDER — COCONUT OIL OIL: 1.0000 "application " | TOPICAL_OIL | 0 refills | Status: DC | PRN

## 2020-03-14 MED ORDER — IBUPROFEN 600 MG PO TABS: 600.0000 mg | ORAL_TABLET | Freq: Three times a day (TID) | ORAL | 0 refills | Status: DC | PRN

## 2020-03-14 NOTE — Discharge Instructions (Signed)
Postpartum Care After Vaginal Delivery This sheet gives you information about how to care for yourself from the time you deliver your baby to up to 6-12 weeks after delivery (postpartum period). Your health care provider may also give you more specific instructions. If you have problems or questions, contact your health care provider. Follow these instructions at home: Vaginal bleeding  It is normal to have vaginal bleeding (lochia) after delivery. Wear a sanitary pad for vaginal bleeding and discharge. ? During the first week after delivery, the amount and appearance of lochia is often similar to a menstrual period. ? Over the next few weeks, it will gradually decrease to a dry, yellow-brown discharge. ? For most women, lochia stops completely by 4-6 weeks after delivery. Vaginal bleeding can vary from woman to woman.  Change your sanitary pads frequently. Watch for any changes in your flow, such as: ? A sudden increase in volume. ? A change in color. ? Large blood clots.  If you pass a blood clot from your vagina, save it and call your health care provider to discuss. Do not flush blood clots down the toilet before talking with your health care provider.  Do not use tampons or douches until your health care provider says this is safe.  If you are not breastfeeding, your period should return 6-8 weeks after delivery. If you are feeding your child breast milk only (exclusive breastfeeding), your period may not return until you stop breastfeeding. Perineal care  Keep the area between the vagina and the anus (perineum) clean and dry as told by your health care provider. Use medicated pads and pain-relieving sprays and creams as directed.  If you had a cut in the perineum (episiotomy) or a tear in the vagina, check the area for signs of infection until you are healed. Check for: ? More redness, swelling, or pain. ? Fluid or blood coming from the cut or tear. ? Warmth. ? Pus or a bad  smell.  You may be given a squirt bottle to use instead of wiping to clean the perineum area after you go to the bathroom. As you start healing, you may use the squirt bottle before wiping yourself. Make sure to wipe gently.  To relieve pain caused by an episiotomy, a tear in the vagina, or swollen veins in the anus (hemorrhoids), try taking a warm sitz bath 2-3 times a day. A sitz bath is a warm water bath that is taken while you are sitting down. The water should only come up to your hips and should cover your buttocks. Breast care  Within the first few days after delivery, your breasts may feel heavy, full, and uncomfortable (breast engorgement). Milk may also leak from your breasts. Your health care provider can suggest ways to help relieve the discomfort. Breast engorgement should go away within a few days.  If you are breastfeeding: ? Wear a bra that supports your breasts and fits you well. ? Keep your nipples clean and dry. Apply creams and ointments as told by your health care provider. ? You may need to use breast pads to absorb milk that leaks from your breasts. ? You may have uterine contractions every time you breastfeed for up to several weeks after delivery. Uterine contractions help your uterus return to its normal size. ? If you have any problems with breastfeeding, work with your health care provider or lactation consultant.  If you are not breastfeeding: ? Avoid touching your breasts a lot. Doing this can make   your breasts produce more milk. ? Wear a good-fitting bra and use cold packs to help with swelling. ? Do not squeeze out (express) milk. This causes you to make more milk. Intimacy and sexuality  Ask your health care provider when you can engage in sexual activity. This may depend on: ? Your risk of infection. ? How fast you are healing. ? Your comfort and desire to engage in sexual activity.  You are able to get pregnant after delivery, even if you have not had  your period. If desired, talk with your health care provider about methods of birth control (contraception). Medicines  Take over-the-counter and prescription medicines only as told by your health care provider.  If you were prescribed an antibiotic medicine, take it as told by your health care provider. Do not stop taking the antibiotic even if you start to feel better. Activity  Gradually return to your normal activities as told by your health care provider. Ask your health care provider what activities are safe for you.  Rest as much as possible. Try to rest or take a nap while your baby is sleeping. Eating and drinking   Drink enough fluid to keep your urine pale yellow.  Eat high-fiber foods every day. These may help prevent or relieve constipation. High-fiber foods include: ? Whole grain cereals and breads. ? Brown rice. ? Beans. ? Fresh fruits and vegetables.  Do not try to lose weight quickly by cutting back on calories.  Take your prenatal vitamins until your postpartum checkup or until your health care provider tells you it is okay to stop. Lifestyle  Do not use any products that contain nicotine or tobacco, such as cigarettes and e-cigarettes. If you need help quitting, ask your health care provider.  Do not drink alcohol, especially if you are breastfeeding. General instructions  Keep all follow-up visits for you and your baby as told by your health care provider. Most women visit their health care provider for a postpartum checkup within the first 3-6 weeks after delivery. Contact a health care provider if:  You feel unable to cope with the changes that your child brings to your life, and these feelings do not go away.  You feel unusually sad or worried.  Your breasts become red, painful, or hard.  You have a fever.  You have trouble holding urine or keeping urine from leaking.  You have little or no interest in activities you used to enjoy.  You have not  breastfed at all and you have not had a menstrual period for 12 weeks after delivery.  You have stopped breastfeeding and you have not had a menstrual period for 12 weeks after you stopped breastfeeding.  You have questions about caring for yourself or your baby.  You pass a blood clot from your vagina. Get help right away if:  You have chest pain.  You have difficulty breathing.  You have sudden, severe leg pain.  You have severe pain or cramping in your lower abdomen.  You bleed from your vagina so much that you fill more than one sanitary pad in one hour. Bleeding should not be heavier than your heaviest period.  You develop a severe headache.  You faint.  You have blurred vision or spots in your vision.  You have bad-smelling vaginal discharge.  You have thoughts about hurting yourself or your baby. If you ever feel like you may hurt yourself or others, or have thoughts about taking your own life, get help  right away. You can go to the nearest emergency department or call:  Your local emergency services (911 in the [place]  A suicide crisis helpline, such as the National Suicide Prevention Lifeline at 3056979524. This is open 24 hours a day. Summary  The period of time right after you deliver your newborn up to 6-12 weeks after delivery is called the postpartum period.  Gradually return to your normal activities as told by your health care provider.  Keep all follow-up visits for you and your baby as told by your health care provider. This information is not intended to replace advice given to you by your health care provider. Make sure you discuss any questions you have with your health care provider. Document Revised: 07/19/2017 Document Reviewed: 04/29/2017 Elsevier Patient Education  2020 [employer].  COVID-19: Isolation  ISOLATION keeps someone who is sick or tested positive for COVID-19 without symptoms away from others, even in their own home. If you  are sick and think or know you have COVID-19  Stay home until after ? At least 10 days since symptoms first appeared and ? At least 24 hours with no fever without fever-reducing medication and ? Symptoms have improved If you tested positive for COVID-19 but do not have symptoms  Stay home until after ? 10 days have passed since your positive test If you live with others, stay in a specific "sick room" or area and away from other people or animals, including pets. Use a separate bathroom, if available. [email or web address] 02/16/2019 This information is not intended to replace advice given to you by your health care provider. Make sure you discuss any questions you have with your health care provider. Document Revised: 07/02/2019 Document Reviewed: 07/02/2019 Elsevier Patient Education  2020 [employer].

## 2020-03-14 NOTE — Progress Notes (Signed)
Pt c/o soreness and rawness to both nipples and areolas. On assessment, both nipples and areolas very pink/red. Right worse than left with outline of redness similar to flange from pump. Pt reports worse since she started using DEBP. Pt allergic to coconut so cannot use coconut oil to lubricate flange. Instructed on use of comfort gels at bedside and assisted pt in putting them on. Discussed with LC. LC requests RN measure nipple size to see if pt needs different flange size. Both nipples measured 1.5cm x 2cm. LC suggests size 21 flange for right side and size 24 for left side. Size 21 taken to pt.

## 2020-03-16 ENCOUNTER — Inpatient Hospital Stay (HOSPITAL_COMMUNITY): Admission: AD | Admit: 2020-03-16 | Payer: Medicaid Other | Source: Home / Self Care | Admitting: Family Medicine

## 2020-03-16 ENCOUNTER — Inpatient Hospital Stay (HOSPITAL_COMMUNITY): Payer: Medicaid Other

## 2020-04-13 ENCOUNTER — Telehealth: Payer: Medicaid Other | Admitting: Advanced Practice Midwife

## 2020-05-30 ENCOUNTER — Other Ambulatory Visit: Payer: Self-pay

## 2020-06-01 ENCOUNTER — Ambulatory Visit: Payer: Medicaid Other

## 2020-06-01 ENCOUNTER — Ambulatory Visit: Payer: Medicaid Other | Admitting: Advanced Practice Midwife

## 2020-06-06 ENCOUNTER — Other Ambulatory Visit: Payer: Self-pay

## 2020-06-06 MED ORDER — MEDROXYPROGESTERONE ACETATE 150 MG/ML IM SUSP
150.0000 mg | INTRAMUSCULAR | 0 refills | Status: DC
Start: 2020-06-06 — End: 2020-09-12

## 2020-06-08 ENCOUNTER — Ambulatory Visit: Payer: Medicaid Other

## 2020-06-09 ENCOUNTER — Other Ambulatory Visit: Payer: Self-pay

## 2020-06-09 ENCOUNTER — Ambulatory Visit (INDEPENDENT_AMBULATORY_CARE_PROVIDER_SITE_OTHER): Payer: Medicaid Other

## 2020-06-09 VITALS — BP 110/72 | HR 80 | Wt 165.0 lb

## 2020-06-09 DIAGNOSIS — Z3042 Encounter for surveillance of injectable contraceptive: Secondary | ICD-10-CM | POA: Diagnosis not present

## 2020-06-09 MED ORDER — MEDROXYPROGESTERONE ACETATE 150 MG/ML IM SUSP
150.0000 mg | Freq: Once | INTRAMUSCULAR | Status: AC
  Administered 2020-06-09: 150 mg via INTRAMUSCULAR

## 2020-06-09 MED ORDER — MEDROXYPROGESTERONE ACETATE 150 MG/ML IM SUSP
150.0000 mg | INTRAMUSCULAR | 3 refills | Status: DC
Start: 2020-06-09 — End: 2023-12-25

## 2020-06-09 NOTE — Progress Notes (Signed)
24 y.o. GYN presents for DEPO Injection, given in RD, tolerated well.  Last PAP 10/23/2019  Next DEPO Jan. 27 - Feb. 10, 2022  Administrations This Visit    medroxyPROGESTERone (DEPO-PROVERA) injection 150 mg    Admin Date 06/09/2020 Action Given Dose 150 mg Route Intramuscular Administered By Maretta Bees, RMA

## 2020-08-29 ENCOUNTER — Ambulatory Visit: Payer: Medicaid Other

## 2020-09-12 ENCOUNTER — Ambulatory Visit: Payer: Medicaid Other

## 2020-09-12 ENCOUNTER — Ambulatory Visit (INDEPENDENT_AMBULATORY_CARE_PROVIDER_SITE_OTHER): Payer: Medicaid Other

## 2020-09-12 VITALS — BP 128/81 | HR 92 | Ht 61.0 in | Wt 169.0 lb

## 2020-09-12 DIAGNOSIS — Z3042 Encounter for surveillance of injectable contraceptive: Secondary | ICD-10-CM | POA: Diagnosis not present

## 2020-09-12 LAB — POCT URINE PREGNANCY: Preg Test, Ur: NEGATIVE

## 2020-09-12 MED ORDER — MEDROXYPROGESTERONE ACETATE 150 MG/ML IM SUSP
150.0000 mg | Freq: Once | INTRAMUSCULAR | Status: AC
  Administered 2020-09-12: 150 mg via INTRAMUSCULAR

## 2020-09-12 NOTE — Progress Notes (Signed)
Pt presents today for Depo Provera. Pt last day for the most recent injection was on 09/08/2020. Pt denies any intercourse since injection expiration.Since patient is late, will do upt. UPT in office was negative. Pt given Depo Provera 150mg  IM L Deltoid. No adverse side effects noted. Pt next injection due 11/28/20-12/12/20. Pt is also due for annual exam after 10/22/20. Pt made both appointments today at checkout.

## 2020-09-18 IMAGING — US US MFM OB FOLLOW-UP
1 series · 12 of 28 positions shown · non-contrast
Comparison: none

[Series 1: us mfm ob follow-up · 123 acquisitions, 12 frames shown]
[im 5/123]
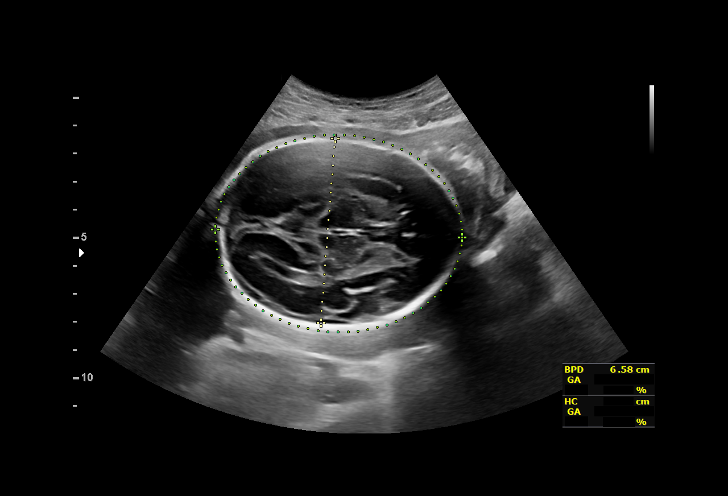
[im 14/123]
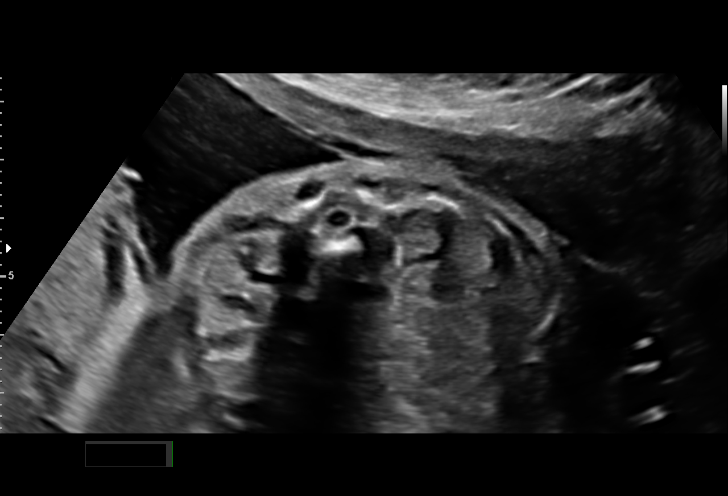
[im 23/123]
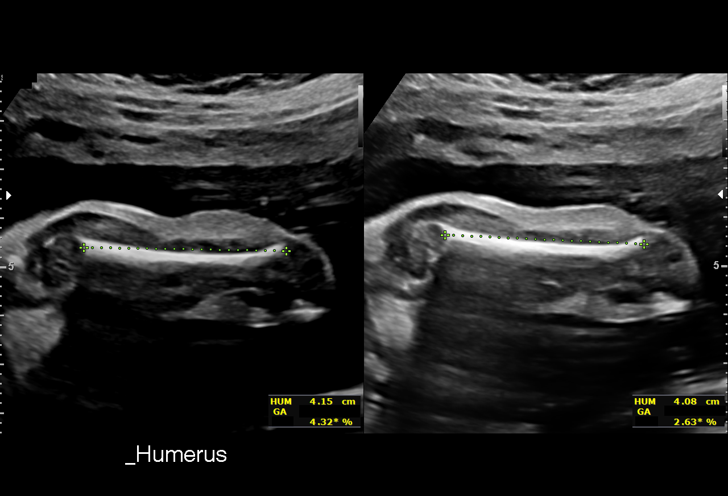
[im 37/123]
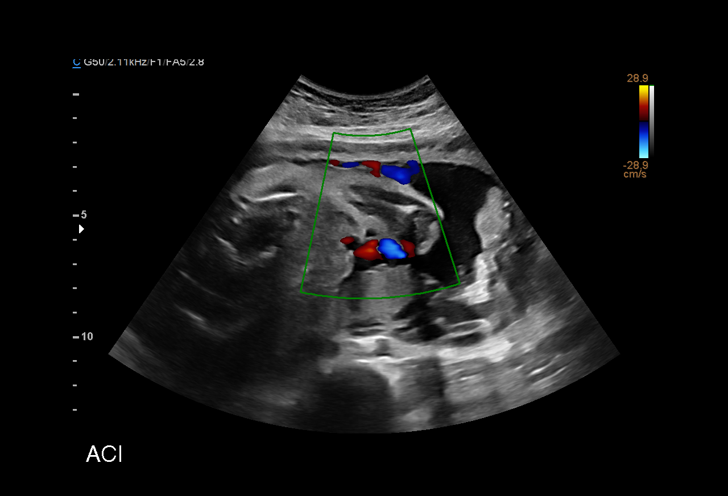
[im 46/123]
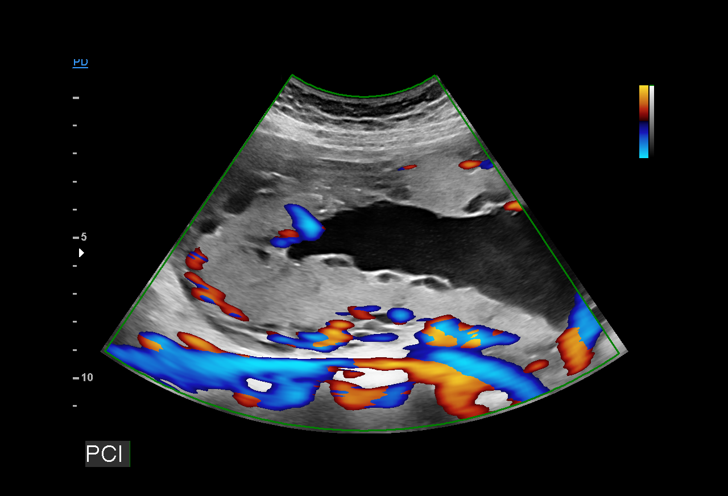
[im 55/123]
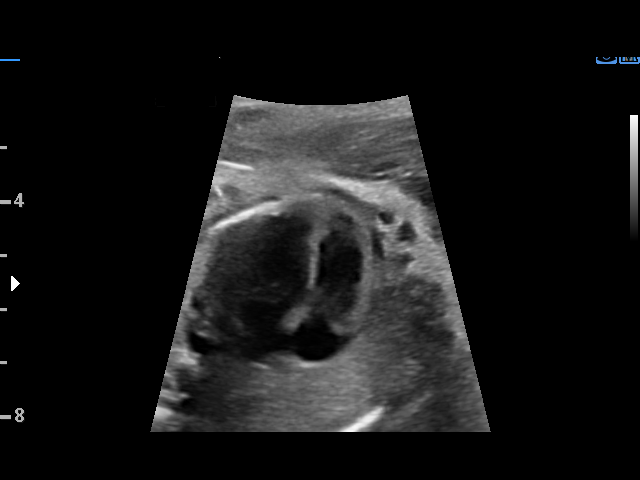
[im 68/123]
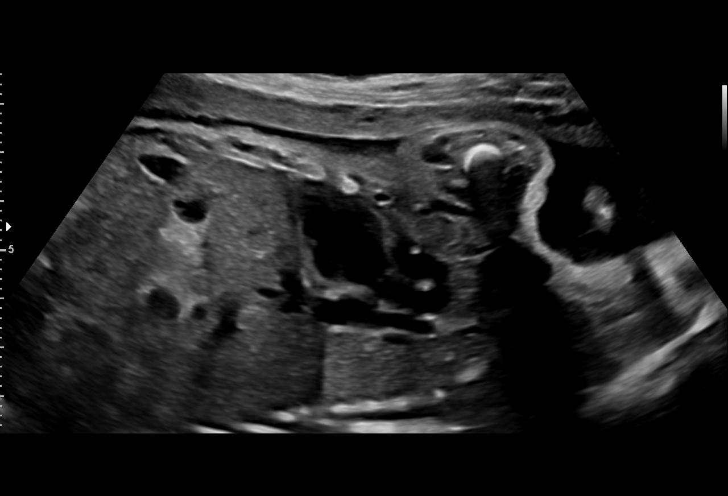
[im 77/123]
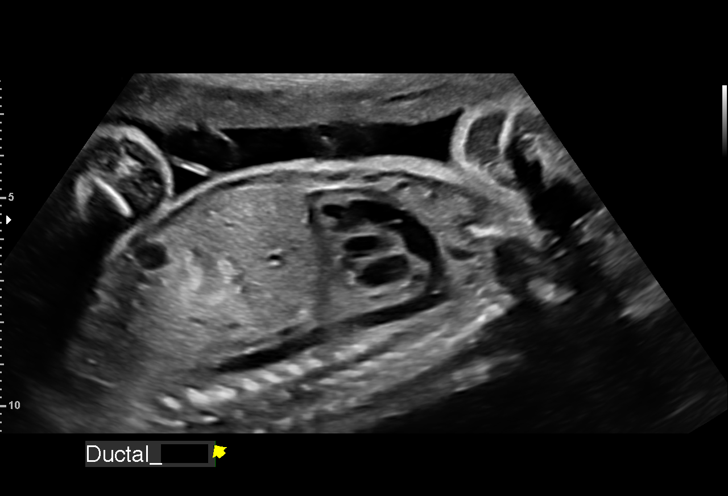
[im 86/123]
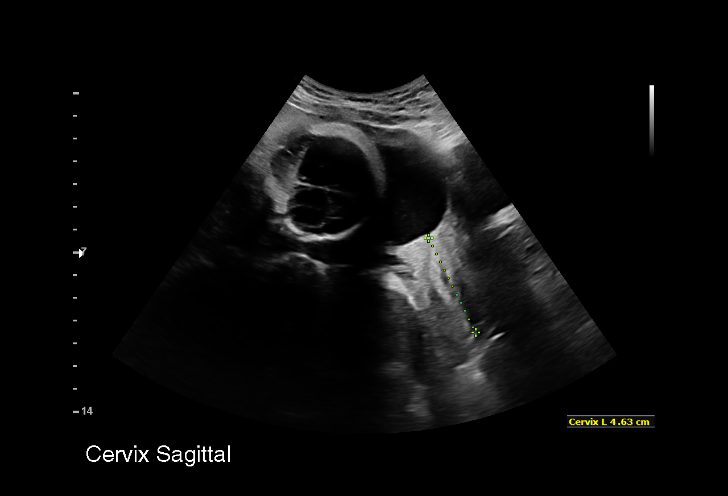
[im 100/123]
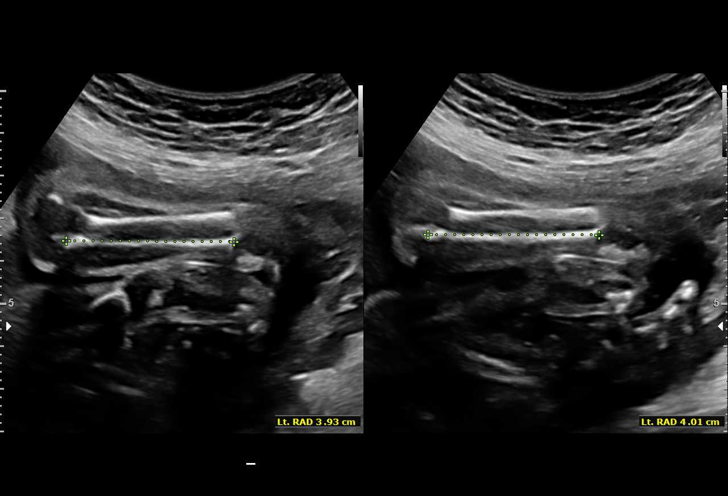
[im 109/123]
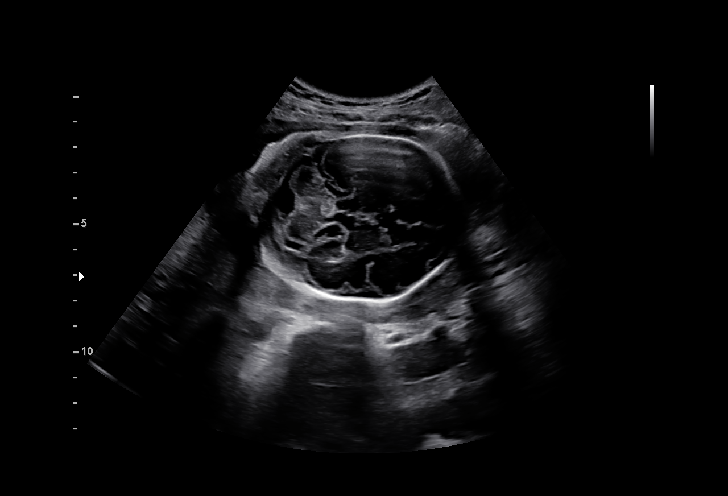
[im 118/123]
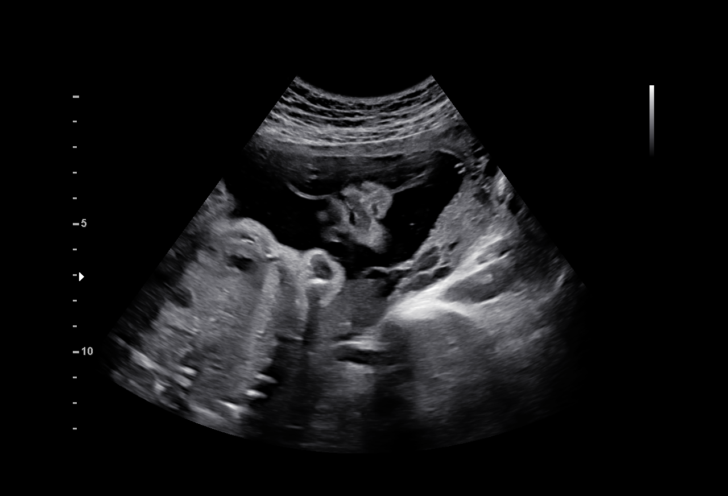

[12 of 28 positions shown; findings below may reference images not displayed]

2  US MFM UA CORD DOPPLER                76820.02    RECARDIANO PERAK

Indications

 Maternal care for known or suspected poor
 fetal growth, second trimester, not applicable
 or unspecified IUGR
 Encounter for other antenatal screening
 follow-up
 27 weeks gestation of pregnancy
 LR NIPS, neg horizon
Fetal Evaluation

 Num Of Fetuses:         1
 Fetal Heart Rate(bpm):  135
 Cardiac Activity:       Observed
 Presentation:           Cephalic
 Placenta:               Right lateral

 Amniotic Fluid
 AFI FV:      Within normal limits

                             Largest Pocket(cm)

Biometry

 BPD:      66.6  mm     G. Age:  26w 6d         24  %    CI:         72.8   %    70 - 86
                                                         FL/HC:      18.0   %    18.6 -
 HC:      248.2  mm     G. Age:  27w 0d         14  %    HC/AC:      1.18        1.05 -
 AC:      210.7  mm     G. Age:  25w 4d          6  %    FL/BPD:     67.3   %    71 - 87
 FL:       44.8  mm     G. Age:  24w 5d        < 1  %    FL/AC:      21.3   %    20 - 24
 HUM:      41.5  mm     G. Age:  25w 1d        < 5  %
 LV:        2.2  mm

  RIGHT
 ULN:      35.2  mm     G. Age:  23w 5d        < 5  %
 TIB:      38.6  mm     G. Age:  24w 5d        < 5  %
 RAD:      39.9  mm     G. Age:  27w 6d         55  %
 FIB:      37.8  mm     G. Age:  24w 2d         23  %
  LEFT
 ULN:      35.2  mm     G. Age:  23w 5d        < 5  %
 TIB:      38.6  mm     G. Age:  24w 5d        < 5  %
 RAD:      39.9  mm     G. Age:  27w 6d         55  %
 FIB:      37.8  mm     G. Age:  24w 2d         23  %

 Est. FW:     818  gm    1 lb 13 oz     2.2  %
OB History

 Gravidity:    3         Term:   2
 Living:       2
Gestational Age

 U/S Today:     26w 0d                                        EDD:   03/21/20
 Best:          27w 2d     Det. By:  U/S  (09/21/19)          EDD:   03/12/20
Anatomy

 Cranium:               Appears normal         LVOT:                   Appears normal
 Cavum:                 Appears normal         Aortic Arch:            Appears normal
 Ventricles:            Appears normal         Ductal Arch:            Appears normal
 Choroid Plexus:        Previously seen        Diaphragm:              Appears normal
 Cerebellum:            Appears normal         Stomach:                Appears normal, left
                                                                       sided
 Posterior Fossa:       Appears normal         Abdomen:                Appears normal
 Nuchal Fold:           Not applicable (>20    Abdominal Wall:         Not well visualized
                        wks GA)
 Face:                  Orbits nl; profile     Cord Vessels:           Appears normal (3
                        prev seen                                      vessel cord)
 Lips:                  Appears normal         Kidneys:                Appear normal
 Palate:                Not well visualized    Bladder:                Appears normal
 Thoracic:              Appears normal         Spine:                  Ltd views no
                                                                       intracranial signs of
                                                                       NTD
 Heart:                 Appears normal         Upper Extremities:      Abnormal, see
                        (4CH, axis, and
                        situs)
                                                                       comments

 RVOT:                  Appears normal         Lower Extremities:      Abnormal, see
                                                                       comments

 Other:  Technically difficult due to fetal position.
Doppler - Fetal Vessels

 Umbilical Artery
  S/D     %tile                                              ADFV    RDFV
  2.51       18                                                 No      No

Cervix Uterus Adnexa

 Cervix
 Length:           4.63  cm.
 Normal appearance by transabdominal scan.
Comments

 This patient was seen for an ultrasound to assess the fetal
 growth.  She denies any problems in her current pregnancy
 and denies any significant past medical history.  She reports
 a history of having smaller babies.  She reports that her two
 prior children were delivered at term weighing between 6-1/2
 to 6 pounds 14 ounces.

 On today's exam, the overall fetal growth measures at the
 2nd percentile for her gestational age, indicating fetal growth
 restriction.  There was normal amniotic fluid noted.  The
 smaller fetal size is primarily due to the shortened long bones
 that were noted today.

 The patient had a cell free DNA test earlier in her pregnancy
 that indicated a low risk for trisomy 21, 18, and 13.  She is
 comfortable with these results and declined any further
 testing.

 Doppler studies of the umbilical arteries performed today due
 to fetal growth restriction shows a normal S/D ratio.  There
 were no signs of absent or reversed end-diastolic flow.

 A possible succenturiate lobe of the placenta was also noted
 today.  The views of the fetal anatomy were limited today due
 to her advanced gestational age.

 Due to fetal growth restriction, we will continue to follow her
 with weekly fetal testing and weekly umbilical artery Doppler
 studies.  The patient understands that an earlier delivery may
 be indicated should fetal growth restriction continue to be
 noted during her future ultrasound exams.

 Another exam was scheduled in 1 week.

## 2020-09-27 IMAGING — US US MFM UA CORD DOPPLER
1 series · 15 of 28 positions shown · non-contrast
Comparison: none

[Series 1: us mfm ua cord doppler · 15 of 35 slices shown]
[im 1/35]
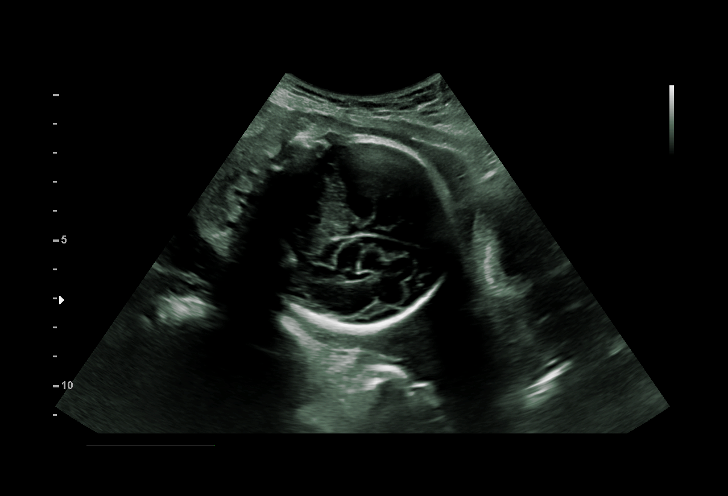
[im 3/35]
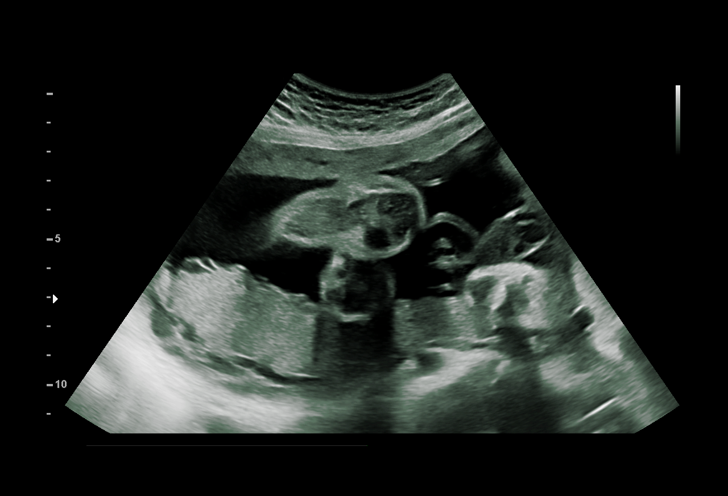
[im 6/35]
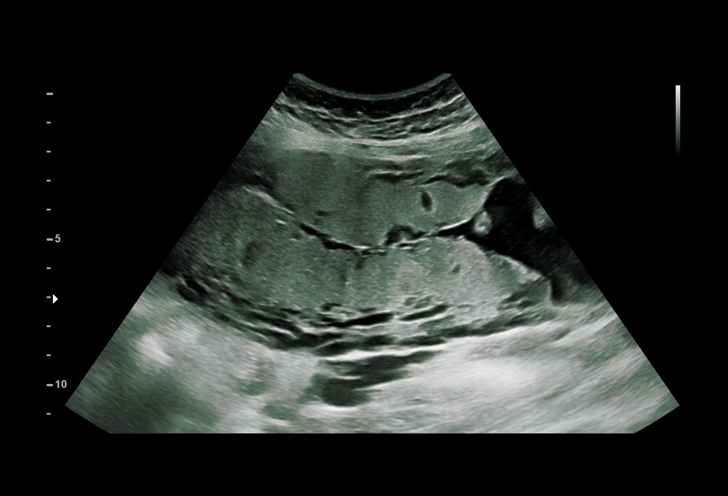
[im 8/35]
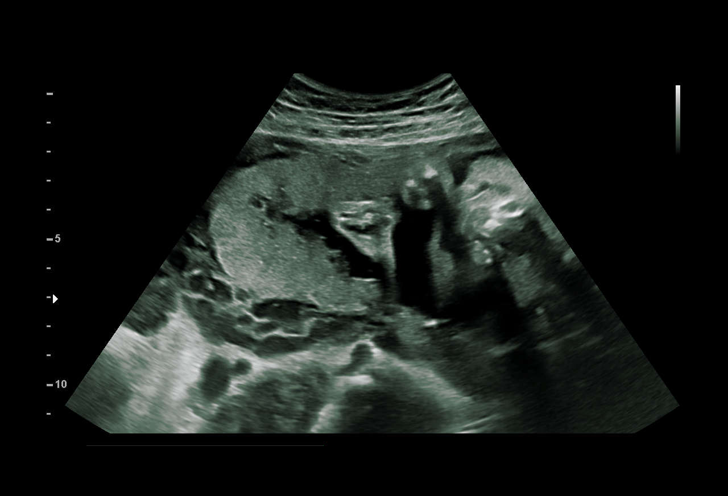
[im 11/35]
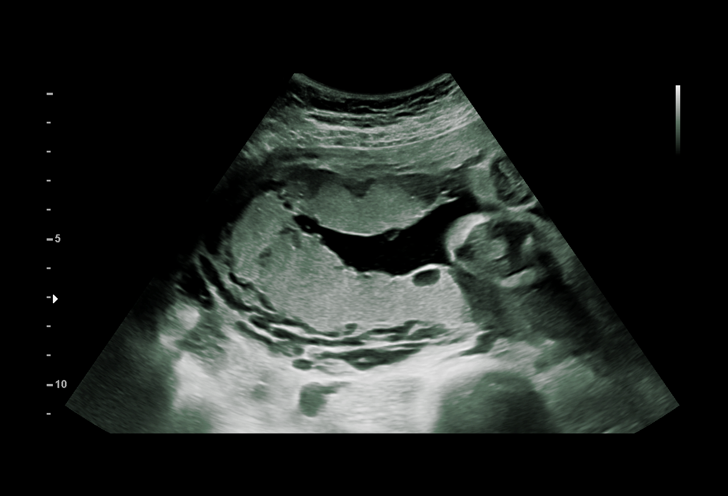
[im 13/35]
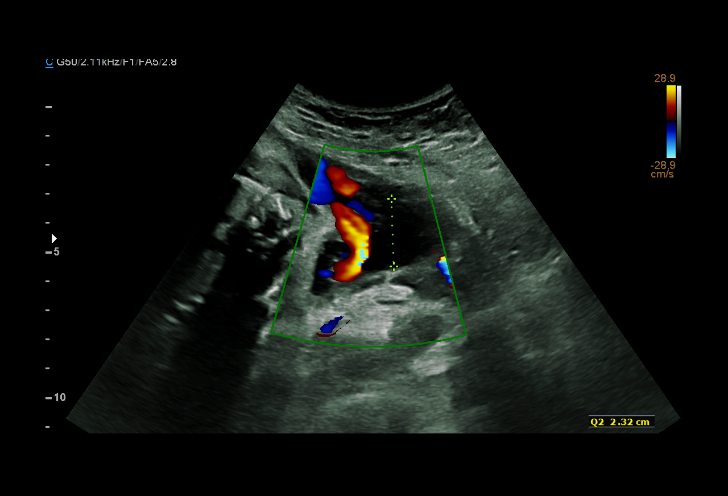
[im 16/35]
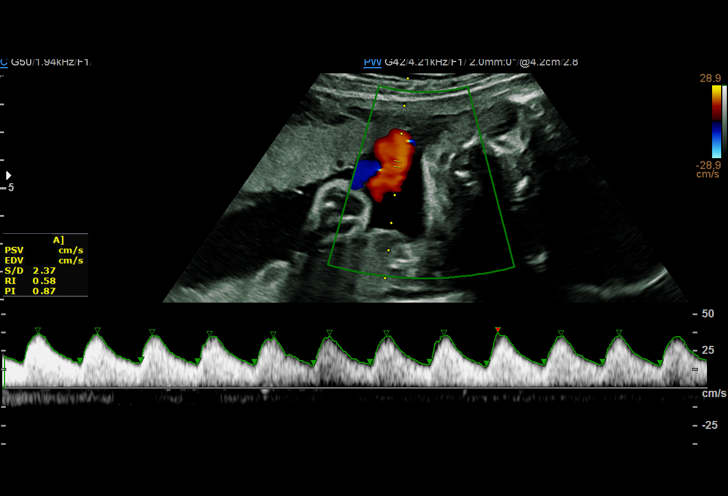
[im 18/35]
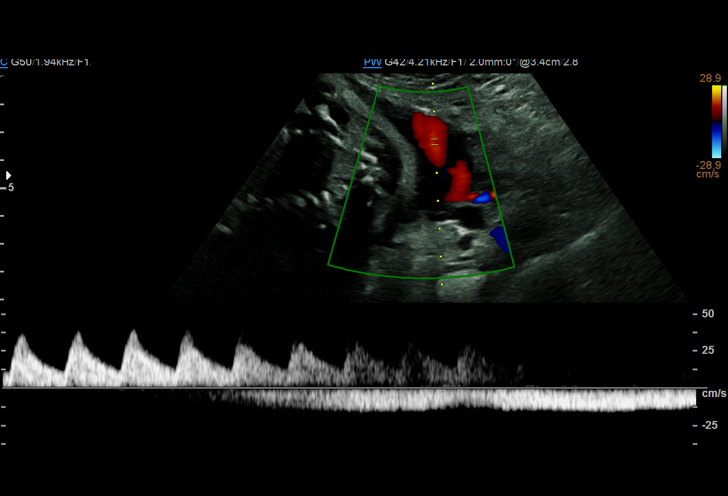
[im 19/35]
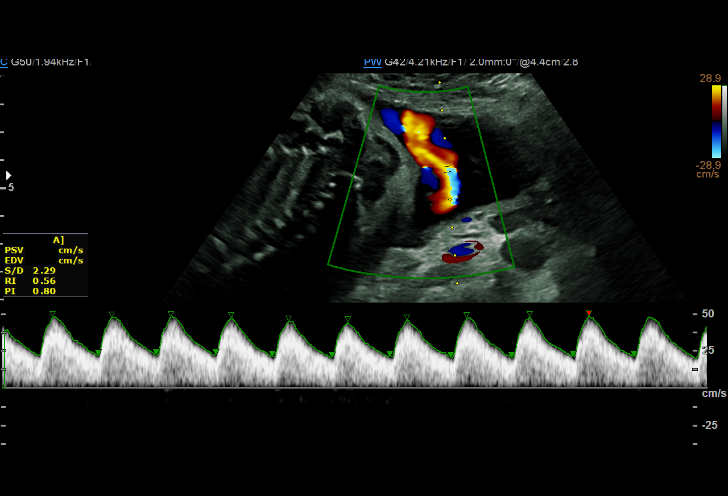
[im 22/35]
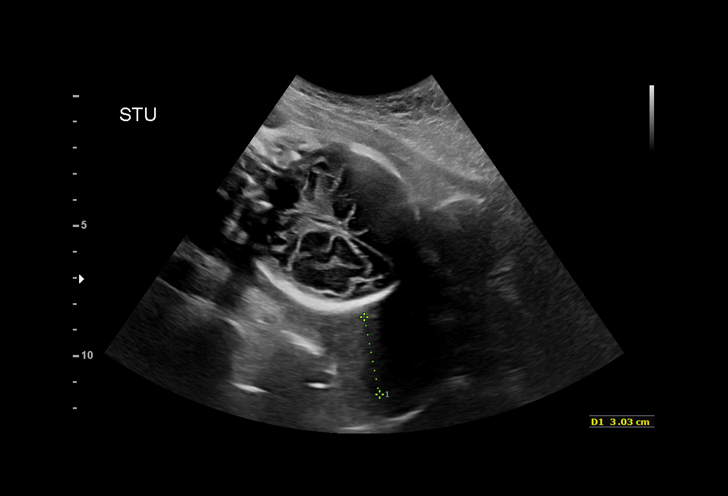
[im 24/35]
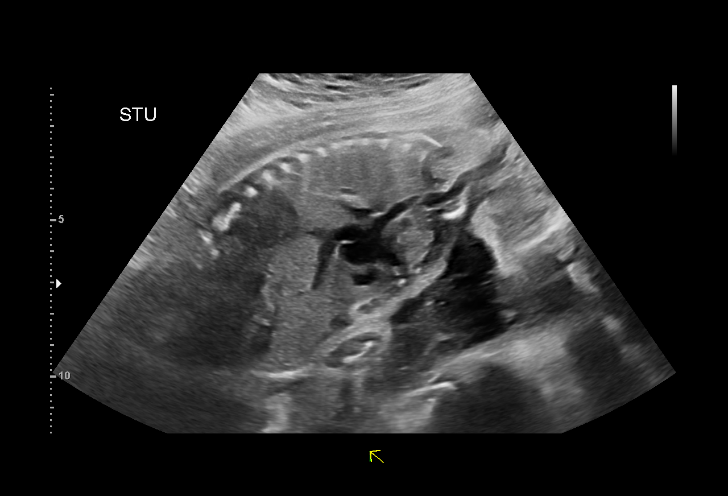
[im 27/35]
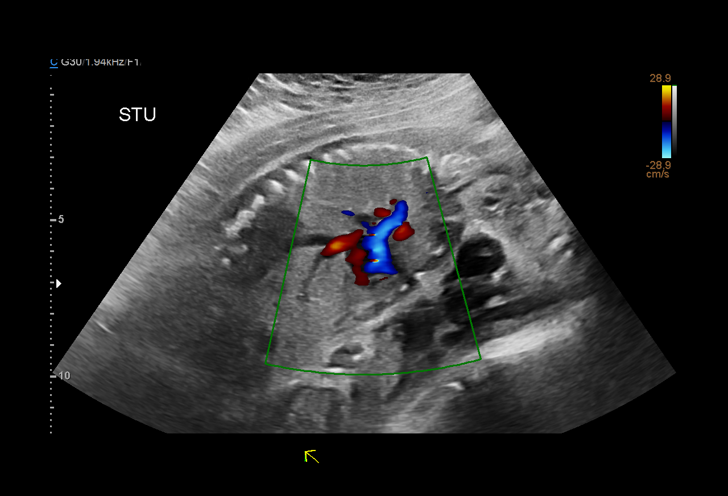
[im 29/35]
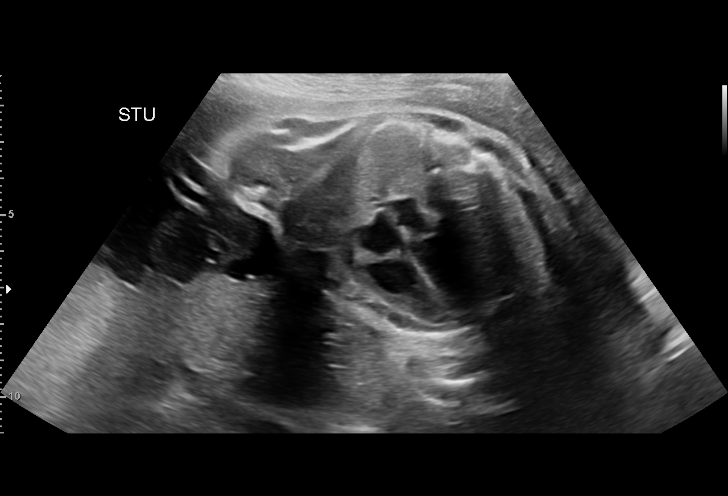
[im 32/35]
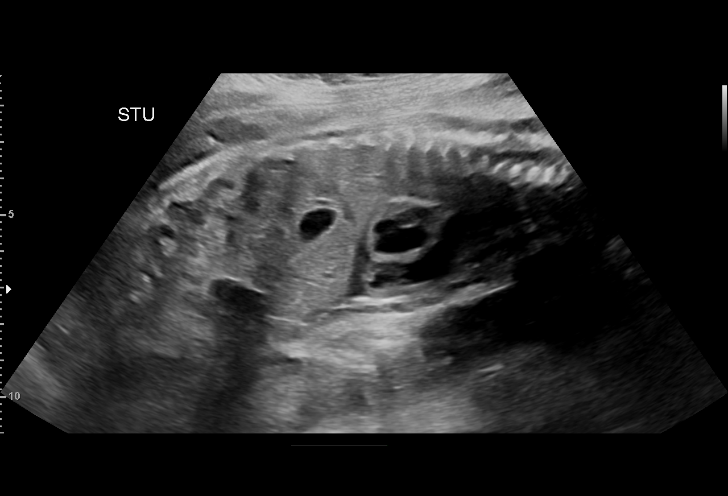
[im 35/35]
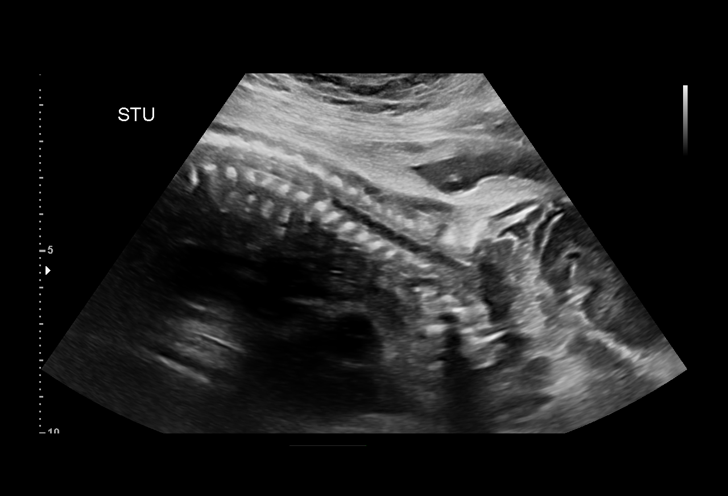

[15 of 28 positions shown; findings below may reference images not displayed]

Indications

 28 weeks gestation of pregnancy
 Maternal care for known or suspected poor
 fetal growth, second trimester, not applicable
 or unspecified IUGR
 Encounter for other antenatal screening
 follow-up
 LR NIPS, neg horizon
Fetal Evaluation

 Num Of Fetuses:          1
 Fetal Heart Rate(bpm):   137
 Cardiac Activity:        Observed
 Presentation:            Cephalic
 Placenta:                Right lateral

 Amniotic Fluid
 AFI FV:      Within normal limits

 AFI Sum(cm)     %Tile       Largest Pocket(cm)
 10.3            14

 RUQ(cm)       RLQ(cm)        LUQ(cm)        LLQ(cm)

OB History

 Gravidity:     3         Term:  2
 Living:        2
Gestational Age

 Best:           28w 4d    Det. By:  U/S  (09/21/19)            EDD:  03/12/20
Doppler - Fetal Vessels

 Umbilical Artery
   S/D     %tile      RI               PI                     ADFV    RDFV
  2.24        10   0.55             0.79                         No      No

Impression

 Antenatal testing given IUGR
 Normal amniotic fluid, fetal movement and UA Dopplers
 observed today
 NST reactive

Recommendations

 Continue weekly testing with UA doppler

## 2020-10-25 ENCOUNTER — Ambulatory Visit: Payer: Medicaid Other | Admitting: Advanced Practice Midwife

## 2020-12-01 ENCOUNTER — Ambulatory Visit: Payer: Medicaid Other

## 2020-12-08 ENCOUNTER — Other Ambulatory Visit: Payer: Self-pay

## 2020-12-08 ENCOUNTER — Ambulatory Visit (INDEPENDENT_AMBULATORY_CARE_PROVIDER_SITE_OTHER): Payer: Medicaid Other

## 2020-12-08 VITALS — BP 106/75 | HR 74

## 2020-12-08 DIAGNOSIS — Z3042 Encounter for surveillance of injectable contraceptive: Secondary | ICD-10-CM

## 2020-12-08 MED ORDER — MEDROXYPROGESTERONE ACETATE 150 MG/ML IM SUSP
150.0000 mg | INTRAMUSCULAR | 0 refills | Status: DC
Start: 2020-12-08 — End: 2023-12-25

## 2020-12-08 MED ORDER — MEDROXYPROGESTERONE ACETATE 150 MG/ML IM SUSP
150.0000 mg | Freq: Once | INTRAMUSCULAR | Status: AC
  Administered 2020-12-08: 150 mg via INTRAMUSCULAR

## 2020-12-08 NOTE — Progress Notes (Signed)
Patient presented today for depo-provera.  Last dose: 09/12/2020 HCG Serum:N/A Depo-Provera 150 mg given in left arm IM. No side effects to noted. Next injection: August 2022 NDC# 16109-604-54

## 2021-01-09 ENCOUNTER — Other Ambulatory Visit (HOSPITAL_COMMUNITY)
Admission: RE | Admit: 2021-01-09 | Discharge: 2021-01-09 | Disposition: A | Discharge status: Home / Self Care | Payer: Medicaid Other | Source: Ambulatory Visit | Attending: Advanced Practice Midwife | Admitting: Advanced Practice Midwife

## 2021-01-09 ENCOUNTER — Other Ambulatory Visit: Payer: Self-pay

## 2021-01-09 ENCOUNTER — Ambulatory Visit (INDEPENDENT_AMBULATORY_CARE_PROVIDER_SITE_OTHER): Payer: Medicaid Other | Admitting: Advanced Practice Midwife

## 2021-01-09 ENCOUNTER — Encounter: Payer: Self-pay | Admitting: Advanced Practice Midwife

## 2021-01-09 VITALS — BP 110/75 | HR 81 | Ht 61.0 in | Wt 172.0 lb

## 2021-01-09 DIAGNOSIS — Z30430 Encounter for insertion of intrauterine contraceptive device: Secondary | ICD-10-CM | POA: Diagnosis not present

## 2021-01-09 DIAGNOSIS — Z113 Encounter for screening for infections with a predominantly sexual mode of transmission: Secondary | ICD-10-CM | POA: Insufficient documentation

## 2021-01-09 DIAGNOSIS — Z3009 Encounter for other general counseling and advice on contraception: Secondary | ICD-10-CM | POA: Diagnosis not present

## 2021-01-09 DIAGNOSIS — Z01419 Encounter for gynecological examination (general) (routine) without abnormal findings: Secondary | ICD-10-CM | POA: Diagnosis not present

## 2021-01-09 DIAGNOSIS — Z975 Presence of (intrauterine) contraceptive device: Secondary | ICD-10-CM | POA: Insufficient documentation

## 2021-01-09 MED ORDER — LEVONORGESTREL 20.1 MCG/DAY IU IUD
1.0000 | INTRAUTERINE_SYSTEM | Freq: Once | INTRAUTERINE | Status: AC
  Administered 2021-01-09: 17:00:00 1 via INTRAUTERINE

## 2021-01-09 NOTE — Progress Notes (Signed)
Patient presents for Annual Exam.  LMP: No cycles with Depo  Last pap:10/23/2019 +HPV   Contraception: Depo given 12/08/20 considering changing from Depo due to weight gain has been working out since delivery still not losing weight.  STD Screening: Desires  Family Hx of Breast Cancer: None   CC: after pregnancy pt has had issue w/ nails and skin on hands may need referral or cream Rx.

## 2021-01-09 NOTE — Progress Notes (Signed)
Subjective:     Tamara Vasquez is a 25 y.o. female here at [facility] for a routine exam.  Current complaints: weight gain with Depo, she did not have this when she was on Depo before. Has been exercising, making diet changes but is not losing weight.  Personal health questionnaire reviewed: yes.  Do you have a primary care provider? yes Do you feel safe at home? yes    Health Maintenance Due  Topic Date Due   COVID-19 Vaccine (1) Never done   HPV VACCINES (1 - 2-dose series) Never done     Risk factors for chronic health problems: Smoking: Never Alchohol/how much: None Pt BMI: Body mass index is 32.5 kg/m.   Gynecologic History No LMP recorded. Patient has had an injection. Contraception: Depo-Provera injections Last Pap: 10/23/2019. Results were: normal. 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 Last mammogram: n/a. Results were: n/a  Obstetric History OB History  Gravida Para Term Preterm AB Living  3 3 3     3   SAB IAB Ectopic Multiple Live Births        0 1    # Outcome Date GA Lbr Len/2nd Weight Sex Delivery Anes PTL Lv  3 Term 03/12/20 [redacted]w[redacted]d 03:17 / 00:21 6 lb 11.4 oz (3.045 kg) M Vag-Spont None  LIV  2 Term 04/27/19    M Vag-Spont     1 Term 06/05/17    F Vag-Spont        The following portions of the patient's history were reviewed and updated as appropriate: allergies, current medications, past family history, past medical history, past social history, past surgical history, and problem list.  Review of Systems Pertinent items noted in HPI and remainder of comprehensive ROS otherwise negative.    Objective:   BP 110/75   Pulse 81   Ht 5\' 1"  (1.549 m)   Wt 172 lb (78 kg)   BMI 32.50 kg/m  VS reviewed, nursing note reviewed,  Constitutional: well developed, well nourished, no distress HEENT: normocephalic CV: normal rate Pulm/chest wall: normal effort Breast Exam:  Deferred with low risks and shared decision making, discussed recommendation to start  mammogram between 40-50 yo/ exam  Abdomen: soft Neuro: alert and oriented x 3 Skin: warm, dry Psych: affect normal Pelvic exam: Performed: Cervix pink, visually closed, without lesion, scant white creamy discharge, vaginal walls and external genitalia normal Bimanual exam: Cervix 0/long/high, firm, anterior, neg CMT, uterus nontender, nonenlarged, adnexa without tenderness, enlargement, or mass   IUD Procedure Note Patient identified, informed consent performed.  Discussed risks of irregular bleeding, cramping, infection, malpositioning or misplacement of the IUD outside the uterus which may require further procedures. Time out was performed.  Urine pregnancy test negative.  Speculum placed in the vagina.  Cervix visualized.  Cleaned with Betadine x 2.  Grasped anteriorly with a single tooth tenaculum.  Uterus sounded to 7 cm.  Liletta IUD placed per manufacturer's recommendations.  Strings trimmed to 3 cm. Tenaculum was removed, good hemostasis noted.  Patient tolerated procedure well.   Patient was given post-procedure instructions and the Liletta care card with expiration date.  Patient was also asked to check IUD strings periodically and follow up in 4-6 weeks for IUD check.     Assessment/Plan:   1. Screening for STD (sexually transmitted disease)  - Hepatitis B surface antigen - Hepatitis C antibody - HIV Antibody (routine testing w rflx) - RPR - Cervicovaginal ancillary only( [place])  2. Encounter for IUD  insertion  - levonorgestrel (LILETTA) 20.1 MCG/DAY IUD 1 each  3. Well woman exam with routine gynecological exam --Doing well, no gyn concerns but desire to switch from Depo to IUD.  4. Encounter for counseling regarding contraception Discussed LARCs as most effective forms of birth control.  Discussed benefits/risks of other methods.  Pt desires IUD today.   --Liletta placed without difficulty, see procedure note above.      Follow up in: 4 weeks for string  check or as needed.   Sharen Counter, CNM 3:44 PM

## 2021-01-09 NOTE — Patient Instructions (Signed)

## 2021-01-10 LAB — RPR: RPR Ser Ql: NONREACTIVE

## 2021-01-10 LAB — HEPATITIS B SURFACE ANTIGEN: Hepatitis B Surface Ag: NEGATIVE

## 2021-01-10 LAB — HEPATITIS C ANTIBODY: Hep C Virus Ab: <0.1 s/co ratio (ref 0.0–0.9)

## 2021-01-10 LAB — HIV ANTIBODY (ROUTINE TESTING W REFLEX): HIV Screen 4th Generation wRfx: NONREACTIVE

## 2021-01-11 LAB — CERVICOVAGINAL ANCILLARY ONLY
Chlamydia: NEGATIVE
Comment: NEGATIVE
Comment: NEGATIVE
Comment: NORMAL
Neisseria Gonorrhea: NEGATIVE
Trichomonas: NEGATIVE

## 2021-02-06 ENCOUNTER — Ambulatory Visit: Payer: Medicaid Other | Admitting: Advanced Practice Midwife

## 2021-02-06 NOTE — Progress Notes (Deleted)
   GYNECOLOGY CLINIC PROGRESS NOTE  History:  25 y.o. [id number] here at [facility] *** today for today for IUD string check; *** IUD was placed  ***. No complaints about the IUD, no concerning side effects.  The following portions of the patient's history were reviewed and updated as appropriate: allergies, current medications, past family history, past medical history, past social history, past surgical history and problem list. Last pap smear on *** was normal, *** HRHPV.  Review of Systems:  Pertinent items are noted in HPI.   Objective:  Physical Exam unknown if currently breastfeeding. Gen: NAD Abd: Soft, nontender and nondistended Pelvic: Normal appearing external genitalia; normal appearing vaginal mucosa and cervix.  IUD strings visualized, about *** cm in length outside cervix.   Assessment & Plan:  Normal IUD check. Patient to keep IUD in place for five years; can come in for removal if she desires pregnancy within the next five years. Routine preventative health maintenance measures emphasized.  Sharen Counter, CNM 9:01 AM

## 2021-02-27 ENCOUNTER — Ambulatory Visit: Payer: Medicaid Other

## 2022-01-31 ENCOUNTER — Ambulatory Visit: Payer: Medicaid Other | Admitting: Advanced Practice Midwife

## 2022-01-31 NOTE — Progress Notes (Deleted)
   Subjective:     Tamara Vasquez is a 26 y.o. female here at [facility] *** for a routine exam.  Current complaints: ***.  Personal health questionnaire reviewed: {yes/no:9010}.  Do you have a primary care provider? *** Do you feel safe at home? ***    Health Maintenance Due  Topic Date Due   COVID-19 Vaccine (1) Never done   HPV VACCINES (1 - 2-dose series) Never done     Risk factors for chronic health problems: Smoking: Alchohol/how much: Pt BMI: There is no height or weight on file to calculate BMI.   Gynecologic History No LMP recorded. Patient has had an injection. Contraception: {method:5051} Last Pap: ***. Results were: {norm/abn:16337} Last mammogram: ***. Results were: {norm/abn:16337}  Obstetric History OB History  Gravida Para Term Preterm AB Living  3 3 3     3   SAB IAB Ectopic Multiple Live Births        0 1    # Outcome Date GA Lbr Len/2nd Weight Sex Delivery Anes PTL Lv  3 Term 03/12/20 [redacted]w[redacted]d 03:17 / 00:21 6 lb 11.4 oz (3.045 kg) M Vag-Spont None  LIV  2 Term 04/27/19    M Vag-Spont     1 Term 06/05/17    F Vag-Spont        {Common ambulatory SmartLinks:19316}  Review of Systems {ros; complete:30496}    Objective:   There were no vitals taken for this visit. VS reviewed, nursing note reviewed,  Constitutional: well developed, well nourished, no distress HEENT: normocephalic CV: normal rate Pulm/chest wall: normal effort Breast Exam:  ***Deferred with low risks and shared decision making, discussed recommendation to start mammogram between 40-50 yo/ exam performed: right breast normal without mass, skin or nipple changes or axillary nodes, left breast normal without mass, skin or nipple changes or axillary nodes Abdomen: soft Neuro: alert and oriented x 3 Skin: warm, dry Psych: affect normal Pelvic exam: ***Deferred/ Performed: Cervix pink, visually closed, without lesion, scant white creamy discharge, vaginal walls and external genitalia  normal Bimanual exam: Cervix 0/long/high, firm, anterior, neg CMT, uterus nontender, nonenlarged, adnexa without tenderness, enlargement, or mass       Assessment/Plan:   1. Well woman exam with routine gynecological exam ***  2. IUD (intrauterine device) in place ***     No follow-ups on file.   13/07/18, CNM 8:40 AM

## 2022-12-10 ENCOUNTER — Encounter: Payer: Self-pay | Admitting: Advanced Practice Midwife

## 2022-12-10 ENCOUNTER — Ambulatory Visit (INDEPENDENT_AMBULATORY_CARE_PROVIDER_SITE_OTHER): Payer: Medicaid Other | Admitting: Advanced Practice Midwife

## 2022-12-10 ENCOUNTER — Other Ambulatory Visit (HOSPITAL_COMMUNITY)
Admission: RE | Admit: 2022-12-10 | Discharge: 2022-12-10 | Disposition: A | Discharge status: Home / Self Care | Payer: Medicaid Other | Source: Ambulatory Visit | Attending: Advanced Practice Midwife | Admitting: Advanced Practice Midwife

## 2022-12-10 VITALS — BP 111/65 | HR 68 | Ht 61.0 in | Wt 165.2 lb

## 2022-12-10 DIAGNOSIS — Z01419 Encounter for gynecological examination (general) (routine) without abnormal findings: Secondary | ICD-10-CM | POA: Diagnosis not present

## 2022-12-10 DIAGNOSIS — Z124 Encounter for screening for malignant neoplasm of cervix: Secondary | ICD-10-CM | POA: Insufficient documentation

## 2022-12-10 DIAGNOSIS — Z113 Encounter for screening for infections with a predominantly sexual mode of transmission: Secondary | ICD-10-CM | POA: Diagnosis present

## 2022-12-10 DIAGNOSIS — R87820 Cervical low risk human papillomavirus (HPV) DNA test positive: Secondary | ICD-10-CM | POA: Insufficient documentation

## 2022-12-10 DIAGNOSIS — B9689 Other specified bacterial agents as the cause of diseases classified elsewhere: Secondary | ICD-10-CM | POA: Diagnosis not present

## 2022-12-10 DIAGNOSIS — N760 Acute vaginitis: Secondary | ICD-10-CM | POA: Diagnosis not present

## 2022-12-10 NOTE — Progress Notes (Signed)
   Subjective:     Tamara Vasquez is a 27 y.o. female here at [facility] for a routine exam.  Current complaints: none.  Personal health questionnaire reviewed: yes.  Do you have a primary care provider? yes Do you feel safe at home? yes  Flowsheet Row Office Visit from 12/10/2022 in [facility] for [facility] Total Score 0       Health Maintenance Due  Topic Date Due   COVID-19 Vaccine (1) Never done   HPV VACCINES (1 - 2-dose series) Never done   PAP-Cervical Cytology Screening  10/23/2022   PAP SMEAR-Modifier  10/23/2022     Risk factors for chronic health problems: Smoking: never Alchohol/how much: none Pt BMI: Body mass index is 31.21 kg/m.   Gynecologic History No LMP recorded. (Menstrual status: IUD). Contraception:   IUD Last Pap: 2021. Results were: normal Last mammogram: n/a.  Obstetric History OB History  Gravida Para Term Preterm AB Living  3 3 3     3   SAB IAB Ectopic Multiple Live Births        0 1    # Outcome Date GA Lbr Len/2nd Weight Sex Delivery Anes PTL Lv  3 Term 03/12/20 [redacted]w[redacted]d 03:17 / 00:21 6 lb 11.4 oz (3.045 kg) M Vag-Spont None  LIV  2 Term 04/27/19    M Vag-Spont     1 Term 06/05/17    F Vag-Spont        The following portions of the patient's history were reviewed and updated as appropriate: allergies, current medications, past family history, past medical history, past social history, past surgical history, and problem list.  Review of Systems Pertinent items noted in HPI and remainder of comprehensive ROS otherwise negative.    Objective:  BP 111/65   Pulse 68   Ht 5\' 1"  (1.549 m)   Wt 165 lb 3.2 oz (74.9 kg)   BMI 31.21 kg/m   VS reviewed, nursing note reviewed,  Constitutional: well developed, well nourished, no distress HEENT: normocephalic, thyroid without enlargement or mass HEART: RRR, no murmurs rubs/gallops RESP: clear and equal to auscultation bilaterally in all lobes  Breast Exam:   exam performed: right breast normal without mass, skin or nipple changes or axillary nodes, left breast normal without mass, skin or nipple changes or axillary nodes Abdomen: soft Neuro: alert and oriented x 3 Skin: warm, dry Psych: affect normal Pelvic exam: Performed: Cervix pink, visually closed, without lesion, scant white creamy discharge, vaginal walls and external genitalia normal Bimanual exam: Cervix 0/long/high, firm, anterior, neg CMT, uterus nontender, nonenlarged, adnexa without tenderness, enlargement, or mass        Assessment/Plan:   1. Encounter for annual routine gynecological examination   2. Encounter for screening for cervical cancer  - Cytology - PAP( [place])  3. Routine screening for STI (sexually transmitted infection)  - Cervicovaginal ancillary only( [place]) - HIV antibody (with reflex) - RPR - Hepatitis C Antibody - Hepatitis B Surface AntiGEN     Return in about 1 year (around 12/10/2023) for annual exam.   Sharen Counter, CNM 12:07 PM

## 2022-12-10 NOTE — Progress Notes (Signed)
Pt presents for AEX. Requesting STD testing and IUD check. No other concerns.

## 2022-12-11 LAB — CERVICOVAGINAL ANCILLARY ONLY
Bacterial Vaginitis (gardnerella): POSITIVE — AB
Candida Glabrata: NEGATIVE
Candida Vaginitis: NEGATIVE
Chlamydia: NEGATIVE
Comment: NEGATIVE
Comment: NEGATIVE
Comment: NEGATIVE
Comment: NEGATIVE
Comment: NEGATIVE
Comment: NORMAL
Neisseria Gonorrhea: NEGATIVE
Trichomonas: NEGATIVE

## 2022-12-11 LAB — RPR: RPR Ser Ql: NONREACTIVE

## 2022-12-11 LAB — HIV ANTIBODY (ROUTINE TESTING W REFLEX): HIV Screen 4th Generation wRfx: NONREACTIVE

## 2022-12-11 LAB — HEPATITIS C ANTIBODY: Hep C Virus Ab: NONREACTIVE

## 2022-12-11 LAB — HEPATITIS B SURFACE ANTIGEN: Hepatitis B Surface Ag: NEGATIVE

## 2022-12-11 MED ORDER — METRONIDAZOLE 500 MG PO TABS
500.0000 mg | ORAL_TABLET | Freq: Two times a day (BID) | ORAL | 0 refills | Status: AC
Start: 2022-12-11 — End: 2022-12-18

## 2022-12-11 NOTE — Addendum Note (Signed)
Addended by: Sharen Counter A on: 12/11/2022 06:00 PM   Modules accepted: Orders

## 2022-12-25 LAB — CYTOLOGY - PAP
Comment: NEGATIVE
Diagnosis: UNDETERMINED — AB
High risk HPV: NEGATIVE

## 2023-12-25 ENCOUNTER — Other Ambulatory Visit (HOSPITAL_COMMUNITY)
Admission: RE | Admit: 2023-12-25 | Discharge: 2023-12-25 | Disposition: A | Discharge status: Home / Self Care | Source: Ambulatory Visit | Attending: Advanced Practice Midwife | Admitting: Advanced Practice Midwife

## 2023-12-25 ENCOUNTER — Encounter: Payer: Self-pay | Admitting: Advanced Practice Midwife

## 2023-12-25 ENCOUNTER — Ambulatory Visit (INDEPENDENT_AMBULATORY_CARE_PROVIDER_SITE_OTHER): Admitting: Advanced Practice Midwife

## 2023-12-25 VITALS — BP 112/79 | HR 97 | Ht 61.0 in | Wt 173.6 lb

## 2023-12-25 DIAGNOSIS — Z124 Encounter for screening for malignant neoplasm of cervix: Secondary | ICD-10-CM

## 2023-12-25 DIAGNOSIS — Z113 Encounter for screening for infections with a predominantly sexual mode of transmission: Secondary | ICD-10-CM | POA: Diagnosis not present

## 2023-12-25 DIAGNOSIS — Z30432 Encounter for removal of intrauterine contraceptive device: Secondary | ICD-10-CM

## 2023-12-25 DIAGNOSIS — Z1151 Encounter for screening for human papillomavirus (HPV): Secondary | ICD-10-CM | POA: Diagnosis not present

## 2023-12-25 DIAGNOSIS — Z01419 Encounter for gynecological examination (general) (routine) without abnormal findings: Secondary | ICD-10-CM | POA: Diagnosis present

## 2023-12-25 DIAGNOSIS — Z1331 Encounter for screening for depression: Secondary | ICD-10-CM

## 2023-12-25 NOTE — Progress Notes (Signed)
 Subjective:     Tamara Vasquez is a 28 y.o. female here at [facility] for a routine exam.  Current complaints: none. Pt reports that her sister has cervical cancer and will need treatment/hysterectomy after her current pregnancy. She desires Pap annually.  She has IUD in place now, and does not have menses. She would like it removed and have periods return. She is thinking about planning a pregnancy in the next year but will use natural family planning if she desires to delay pregnancy.  Personal and family health history reviewed: yes.  Do you have a primary care provider? yes Do you feel safe at home? yes  Flowsheet Row Office Visit from 12/25/2023 in [facility] Total Score 1       Health Maintenance Due  Topic Date Due   COVID-19 Vaccine (1 - 2024-25 season) Never done     Risk factors for chronic health problems: Smoking: Alchohol/how much: Pt BMI: Body mass index is 32.8 kg/m.   Gynecologic History No LMP recorded. (Menstrual status: IUD). Contraception: IUD Last Pap: 12/10/22. Results were: ASCUS with neg HPV. Repeat in 3 years per [facility]. Last mammogram: N/A.   Obstetric History OB History  Gravida Para Term Preterm AB Living  3 3 3   3   SAB IAB Ectopic Multiple Live Births     0 1    # Outcome Date GA Lbr Len/2nd Weight Sex Type Anes PTL Lv  3 Term 03/12/20 [redacted]w[redacted]d 03:17 / 00:21 6 lb 11.4 oz (3.045 kg) M Vag-Spont None  LIV  2 Term 04/27/19    M Vag-Spont     1 Term 06/05/17    F Vag-Spont        The following portions of the patient's history were reviewed and updated as appropriate: allergies, current medications, past family history, past medical history, past social history, past surgical history, and problem list.  Review of Systems Pertinent items noted in HPI and remainder of comprehensive ROS otherwise negative.    Objective:   Today's Vitals   12/25/23 0922  BP: 112/79  Pulse: 97  Weight: 173 lb  9.6 oz (78.7 kg)  Height: 5\' 1"  (1.549 m)   Body mass index is 32.8 kg/m.  VS reviewed, nursing note reviewed,  Constitutional: well developed, well nourished, no distress HEENT: normocephalic, thyroid without enlargement or mass HEART: RRR, no murmurs rubs/gallops RESP: clear and equal to auscultation bilaterally in all lobes  Breast Exam:  exam performed: right breast normal without mass, skin or nipple changes or axillary nodes, left breast normal without mass, skin or nipple changes or axillary nodes Abdomen: soft Neuro: alert and oriented x 3 Skin: warm, dry Psych: affect normal Pelvic exam: Performed: Cervix pink, visually closed, without lesion, scant white creamy discharge, vaginal walls and external genitalia normal Bimanual exam: Cervix 0/long/high, firm, anterior, neg CMT, uterus nontender, nonenlarged, adnexa without tenderness, enlargement, or mass      IUD Removal  Patient was in the dorsal lithotomy position, normal external genitalia was noted.  A speculum was placed in the patient's vagina, normal discharge was noted, no lesions. The multiparous cervix was visualized, no lesions, no abnormal discharge.  The strings of the IUD were grasped and pulled using ring forceps. The IUD was removed in its entirety. Patient tolerated the procedure well.    Patient plans for pregnancy soon and she was told to avoid teratogens, take PNV and folic acid.  Routine  preventative health maintenance measures emphasized.     Assessment/Plan:   1. Routine screening for STI (sexually transmitted infection) (Primary)  - Cytology - PAP( [place]) - Cervicovaginal ancillary only( [place]) - Hepatitis B Surface AntiGEN - Hepatitis C Antibody - HIV antibody (with reflex) - RPR  2. Cervical cancer screening --Added HPV to testing this year, pt under age 35 but concerned due to sister's diagnosis.  Discussed connection btwn HPV and most cervical cancers.  Questions answered.  -  Cytology - PAP( [place])  3. Positive screening for depression on 9-item Patient Health Questionnaire (PHQ-9)  - Amb ref to Integrated Behavioral Health  4. Encounter for IUD removal --IUD removed without difficulty.  See procedure note above.    5. Encounter for annual routine gynecological examination     No follow-ups on file.   Arlester Bence, CNM 12:36 PM

## 2023-12-25 NOTE — Progress Notes (Signed)
 Has had IUD for 4 years, she is wanting it removed so that her body can began to be "normal again". She would like her menses to return. Not concerned in other birth control at this time.   Would like to discuss the process and what to expect with removal of the IUD.

## 2023-12-26 LAB — HIV ANTIBODY (ROUTINE TESTING W REFLEX): HIV Screen 4th Generation wRfx: NONREACTIVE

## 2023-12-26 LAB — CERVICOVAGINAL ANCILLARY ONLY
Chlamydia: NEGATIVE
Comment: NEGATIVE
Comment: NEGATIVE
Comment: NORMAL
Neisseria Gonorrhea: NEGATIVE
Trichomonas: NEGATIVE

## 2023-12-26 LAB — HEPATITIS B SURFACE ANTIGEN: Hepatitis B Surface Ag: NEGATIVE

## 2023-12-26 LAB — RPR: RPR Ser Ql: NONREACTIVE

## 2023-12-26 LAB — HEPATITIS C ANTIBODY: Hep C Virus Ab: NONREACTIVE

## 2023-12-31 ENCOUNTER — Encounter: Payer: Self-pay | Admitting: Advanced Practice Midwife

## 2023-12-31 ENCOUNTER — Ambulatory Visit: Payer: Self-pay | Admitting: Advanced Practice Midwife

## 2023-12-31 DIAGNOSIS — R87810 Cervical high risk human papillomavirus (HPV) DNA test positive: Secondary | ICD-10-CM | POA: Insufficient documentation

## 2023-12-31 LAB — CYTOLOGY - PAP
Comment: NEGATIVE
Diagnosis: NEGATIVE
Diagnosis: REACTIVE
High risk HPV: POSITIVE — AB

## 2024-01-20 ENCOUNTER — Other Ambulatory Visit: Payer: Self-pay | Admitting: Advanced Practice Midwife

## 2024-01-20 DIAGNOSIS — F419 Anxiety disorder, unspecified: Secondary | ICD-10-CM

## 2024-01-20 MED ORDER — BUSPIRONE HCL 5 MG PO TABS
5.0000 mg | ORAL_TABLET | Freq: Three times a day (TID) | ORAL | 2 refills | Status: AC | PRN
Start: 2024-01-20 — End: ?

## 2024-01-20 MED ORDER — SERTRALINE HCL 50 MG PO TABS: 50.0000 mg | ORAL_TABLET | Freq: Every day | ORAL | 3 refills | Status: AC

## 2024-01-20 MED ORDER — SERTRALINE HCL 25 MG PO TABS
25.0000 mg | ORAL_TABLET | Freq: Every day | ORAL | 0 refills | Status: AC
Start: 2024-01-20 — End: 2024-01-27

## 2024-01-20 NOTE — Progress Notes (Unsigned)
 Pt messaged provider regarding recent symptoms of anxiety, especially at work.  Rx sent for Zoloft and Buspar, and pt to f/u with provider in 1-2 months.  Referral placed for integrated behavioral health.

## 2024-01-25 ENCOUNTER — Other Ambulatory Visit: Payer: Self-pay

## 2024-01-25 ENCOUNTER — Ambulatory Visit
Admission: RE | Admit: 2024-01-25 | Discharge: 2024-01-25 | Disposition: A | Discharge status: Home / Self Care | Source: Ambulatory Visit | Attending: Physician Assistant

## 2024-01-25 VITALS — BP 107/73 | HR 79 | Temp 97.6°F | Resp 17 | Ht 61.0 in | Wt 168.0 lb

## 2024-01-25 DIAGNOSIS — J018 Other acute sinusitis: Secondary | ICD-10-CM | POA: Diagnosis not present

## 2024-01-25 DIAGNOSIS — H109 Unspecified conjunctivitis: Secondary | ICD-10-CM | POA: Diagnosis not present

## 2024-01-25 MED ORDER — AMOXICILLIN-POT CLAVULANATE 875-125 MG PO TABS: 1.0000 | ORAL_TABLET | Freq: Two times a day (BID) | ORAL | 0 refills | Status: AC

## 2024-01-25 MED ORDER — ERYTHROMYCIN 5 MG/GM OP OINT: TOPICAL_OINTMENT | Freq: Four times a day (QID) | OPHTHALMIC | 0 refills | Status: AC

## 2024-01-25 NOTE — ED Provider Notes (Signed)
 [facility]    CSN: [id number] Arrival date & time: 01/25/24  1356      History   Chief Complaint Chief Complaint  Patient presents with   Sinus Pressure   Redness of Eye    HPI Tamara Vasquez is a 28 y.o. female.   HPI   Pt reports she has had nasal congestion, sinus pressure, headaches, for the last week She reports concerns for potential pink eye in the right eye that started today.She reports the right eye does not hurt but feels irritated. She denies drainage or vision changes. 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 Authorizing Provider  amoxicillin-clavulanate (AUGMENTIN) 875-125 MG tablet Take 1 tablet by mouth every 12 (twelve) hours for 7 days. 01/25/24 02/01/24 Yes Fritzie Prioleau E, PA-C  erythromycin ophthalmic ointment Place into the right eye 4 (four) times daily for 7 days. Place a 1/2 inch ribbon of ointment into the lower eyelid. 01/25/24 02/01/24 Yes Earline Stiner E, PA-C  busPIRone  (BUSPAR ) 5 MG tablet Take 1 tablet (5 mg total) by mouth 3 (three) times daily as needed. 01/20/24   Leftwich-Kirby, Olam LABOR, CNM  sertraline  (ZOLOFT ) 25 MG tablet  Take 1 tablet (25 mg total) by mouth daily for 7 days. 01/20/24 01/27/24  Leftwich-Kirby, Olam LABOR, CNM  sertraline  (ZOLOFT ) 50 MG tablet Take 1 tablet (50 mg total) by mouth daily. 01/20/24   Leftwich-Kirby, Olam LABOR, CNM    Family History Family History  Problem Relation Age of Onset   Healthy Father    Healthy Mother    Cervical cancer Sister     Social History Social History   Tobacco Use   Smoking status: Never   Smokeless tobacco: Never  Vaping Use   Vaping status: Never Used  Substance Use Topics   Alcohol use: Not Currently   Drug use: Never     Allergies   Coconut flavoring agent (non-screening)   Review of Systems Review of Systems  Constitutional:  Positive for fatigue. Negative for chills and fever.  HENT:  Positive for congestion, ear pain, rhinorrhea, sinus pressure and sinus pain. Negative for sore throat.   Eyes:  Positive for redness. Negative for photophobia, pain, discharge and visual disturbance.  Gastrointestinal:  Negative for diarrhea, nausea and vomiting.  Musculoskeletal:  Negative for myalgias.  Neurological:  Positive for headaches.     Physical Exam Triage Vital Signs ED Triage Vitals  Encounter Vitals Group     BP 01/25/24 1420 107/73     Girls Systolic BP Percentile --      Girls Diastolic BP Percentile --      Boys Systolic BP Percentile --      Boys Diastolic BP Percentile --      Pulse Rate 01/25/24 1420 79     Resp 01/25/24 1420 17     Temp 01/25/24 1420 97.6 F (36.4 C)     Temp Source 01/25/24 1420 Oral     SpO2 01/25/24 1420 97 %     Weight 01/25/24 1422 168 lb (76.2 kg)     Height 01/25/24 1422 5' 1 (1.549 m)     Head Circumference --      Peak Flow --      Pain Score 01/25/24 1421 6     Pain Loc --      Pain Education --      Exclude from Growth Chart --    No data found.  Updated Vital Signs BP 107/73   Pulse 79   Temp 97.6 F (36.4 C) (Oral)   Resp 17   Ht 5' 1 (1.549 m)   Wt 168 lb (76.2 kg)   SpO2 97%    Breastfeeding No   BMI 31.74 kg/m   Visual Acuity Right Eye Distance:   Left Eye Distance:   Bilateral Distance:    Right Eye Near:   Left Eye Near:    Bilateral Near:     Physical Exam Vitals reviewed.  Constitutional:      General: She is awake. She is not in acute distress.    Appearance: Normal appearance. She is well-developed and well-groomed. She is not ill-appearing or toxic-appearing.  HENT:     Head: Normocephalic and atraumatic.     Right Ear: Hearing, tympanic membrane and ear canal normal.     Left Ear: Hearing and ear canal normal. A middle ear effusion is present. Tympanic membrane is retracted.     Mouth/Throat:     Lips: Pink.     Mouth: Mucous membranes are moist.     Pharynx: Oropharynx is clear. Uvula midline. No pharyngeal swelling, oropharyngeal exudate, posterior oropharyngeal erythema, uvula swelling or postnasal drip.   Eyes:     General: Lids are normal. Gaze aligned appropriately.        Right eye: No foreign body, discharge or hordeolum.        Left eye: No foreign body, discharge or hordeolum.     Extraocular Movements: Extraocular movements intact.     Conjunctiva/sclera:     Right eye: Right conjunctiva is injected. No chemosis, exudate or hemorrhage.    Pupils: Pupils are equal, round, and reactive to light.    Cardiovascular:     Rate and Rhythm: Normal rate and regular rhythm.     Pulses: Normal pulses.          Radial pulses are 2+ on the right side and 2+ on the left side.     Heart sounds: Normal heart sounds. No murmur heard.    No friction rub. No gallop.  Pulmonary:     Effort: Pulmonary effort is normal.     Breath sounds: Normal breath sounds. No decreased air movement. No decreased breath sounds, wheezing, rhonchi or rales.   Musculoskeletal:     Cervical back: Normal range of motion and neck  supple.  Lymphadenopathy:     Head:     Right side of head: No submental, submandibular or preauricular adenopathy.     Left side of  head: No submental, submandibular or preauricular adenopathy.     Cervical:     Right cervical: No superficial cervical adenopathy.    Left cervical: No superficial cervical adenopathy.     Upper Body:     Right upper body: No supraclavicular adenopathy.     Left upper body: No supraclavicular adenopathy.   Skin:    General: Skin is warm and dry.   Neurological:     General: No focal deficit present.     Mental Status: She is alert and oriented to person, place, and time.   Psychiatric:        Mood and Affect: Mood normal.        Behavior: Behavior normal. Behavior is cooperative.        Thought Content: Thought content normal.      UC Treatments / Results  Labs (all labs ordered are listed, but only abnormal results are displayed) Labs Reviewed - No data to display  EKG   Radiology No results found.  Procedures Procedures (including critical care time)  Medications Ordered in UC Medications - No data to display  Initial Impression / Assessment and Plan / UC Course  I have reviewed the triage vital signs and the nursing notes.  Pertinent labs & imaging results that were available during my care of the patient were reviewed by me and considered in my medical decision making (see chart for details).      Final Clinical Impressions(s) / UC Diagnoses   Final diagnoses:  Bacterial conjunctivitis of right eye  Other acute sinusitis, recurrence not specified   Patient presents today with concerns of sinus pressure, headaches and cough which is productive for the past week.  She also reports waking up this morning with redness in the right eye.  Physical exam is notable for middle ear effusion on the left ear and retraction consistent with likely increased nasal congestion and pressure.Suspect bacterial sinusitis at this time along with potential bacterial conjunctivitis. Will send in erythromycin ophthalmic ointment to assist with potential infection.  Will send  Augmentin p.o. twice daily x 7 days to assist with bacterial sinusitis.  Recommend over-the-counter medications as needed for further symptomatic relief.  ED and return precautions reviewed and provided in after visit summary.  Follow-up as needed   Discharge Instructions      Based on your symptoms and duration of illness, I believe you may have a bacterial sinus infection  These typically resolve with antibiotic therapy along with at-home comfort measures  Today I have sent in a prescription for  Augmentin 875-125 mg to be taken by mouth twice per day for 7 days FINISH THE ENTIRE COURSE unless you develop an allergic reaction or are instructed to discontinue.  It can take a few days for the antibiotic to kick in so I recommend symptomatic relief with over the counter medication such as the following: Dayquil/ Nyquil Theraflu Alkaseltzer   If you have high blood pressure I recommend that you take Mucinex, Robitussin, Tylenol  instead of the combination medications.  Combination medications typically have a decongestant in them that can cause your blood pressure to be high.  These medications typically have Tylenol  in them already so you do not need to supplement with more outside of those medications  Stay well hydrated with at least 75 oz  of water per day to help with recovery  If at any point you start to develop swelling around the eyes and nose, trouble seeing, fevers that are not responding to medications, trouble breathing, passing out or headaches that are very severe please go to the emergency room for further evaluation management  Based on your symptoms I believe that you have bacterial conjunctivitis  I have sent in a script for Erythromycin ophthalmic ointment - please apply a 1/2 inch strip of the ointment to your right eye every 6 hours for 7 days  You can use sterile eye flushes and lubricating eye drops to assist with eye irritation and further resolution You can also use a  warm compress over the eye to assist with swelling and matting - especially in the morning  If you have used makeup or mascara on that eye I recommend discarding it as this can cause recurrent infection. Thoroughly wash any makeup brushes and avoid using makeup while recovering from the infection.  If you notice the following please return to the office: lack of improvement, eyelid swelling, increased eye irritation If you notice the following please go to the ED: eye pressure causing displacement of the eye, vision changes, increased eye pain or foreign body sensation, fever      ED Prescriptions     Medication Sig Dispense Auth. Provider   erythromycin ophthalmic ointment Place into the right eye 4 (four) times daily for 7 days. Place a 1/2 inch ribbon of ointment into the lower eyelid. 3.5 g Greco Gastelum E, PA-C   amoxicillin-clavulanate (AUGMENTIN) 875-125 MG tablet Take 1 tablet by mouth every 12 (twelve) hours for 7 days. 14 tablet Angelmarie Ponzo E, PA-C      PDMP not reviewed this encounter.   Marylene Rocky BRAVO, PA-C 01/26/24 1157

## 2024-01-25 NOTE — Discharge Instructions (Addendum)
 Based on your symptoms and duration of illness, I believe you may have a bacterial sinus infection  These typically resolve with antibiotic therapy along with at-home comfort measures  Today I have sent in a prescription for  Augmentin 875-125 mg to be taken by mouth twice per day for 7 days FINISH THE ENTIRE COURSE unless you develop an allergic reaction or are instructed to discontinue.  It can take a few days for the antibiotic to kick in so I recommend symptomatic relief with over the counter medication such as the following: Dayquil/ Nyquil Theraflu Alkaseltzer   If you have high blood pressure I recommend that you take Mucinex, Robitussin, Tylenol  instead of the combination medications.  Combination medications typically have a decongestant in them that can cause your blood pressure to be high.  These medications typically have Tylenol  in them already so you do not need to supplement with more outside of those medications  Stay well hydrated with at least 75 oz of water per day to help with recovery  If at any point you start to develop swelling around the eyes and nose, trouble seeing, fevers that are not responding to medications, trouble breathing, passing out or headaches that are very severe please go to the emergency room for further evaluation management  Based on your symptoms I believe that you have bacterial conjunctivitis  I have sent in a script for Erythromycin ophthalmic ointment - please apply a 1/2 inch strip of the ointment to your right eye every 6 hours for 7 days  You can use sterile eye flushes and lubricating eye drops to assist with eye irritation and further resolution You can also use a warm compress over the eye to assist with swelling and matting - especially in the morning  If you have used makeup or mascara on that eye I recommend discarding it as this can cause recurrent infection. Thoroughly wash any makeup brushes and avoid using makeup while recovering  from the infection.  If you notice the following please return to the office: lack of improvement, eyelid swelling, increased eye irritation If you notice the following please go to the ED: eye pressure causing displacement of the eye, vision changes, increased eye pain or foreign body sensation, fever

## 2024-01-25 NOTE — ED Triage Notes (Signed)
 Pt presents with complaints of sinus pressure, headaches, and cough with green mucus x over 1 week. Pt states she woke up this morning 6/27 with a red right eye. Pt currently rates her overall head pain a 6/10. OTC Sudafed taken for symptoms with little relief. Denies fevers at home.

## 2024-01-29 ENCOUNTER — Ambulatory Visit: Payer: Self-pay | Admitting: Licensed Clinical Social Worker

## 2024-01-29 DIAGNOSIS — F4323 Adjustment disorder with mixed anxiety and depressed mood: Secondary | ICD-10-CM

## 2024-01-29 NOTE — BH Specialist Note (Signed)
 Integrated Behavioral Health via Telemedicine Visit  02/04/2024 Allanah Mcfarland [id number]  Number of Integrated Behavioral Health Clinician visits: 1- Initial Visit  Session Start time: 1045   Session End time: 1114  Total time in minutes: 29    Referring Provider: Leftwich-Kirby Patient/Family location: At [facility] Provider location: Remote Office All persons participating in visit: Patient and [facility] Types of Service: Individual psychotherapy and Video visit  I connected with Tamara Vasquez and/or Tamara Nat Hight's patient via  Telephone or Video Enabled Telemedicine Application  (Video is Caregility application) and verified that I am speaking with the correct person using two identifiers. Discussed confidentiality: Yes   I discussed the limitations of telemedicine and the availability of in person appointments.  Discussed there is a possibility of technology failure and discussed alternative modes of communication if that failure occurs.  I discussed that engaging in this telemedicine visit, they consent to the provision of behavioral healthcare and the services will be billed under their insurance.  Patient and/or legal guardian expressed understanding and consented to Telemedicine visit: Yes   Presenting Concerns: Patient and/or family reports the following symptoms/concerns: Increased stressors, housing concerns.  Duration of problem: Months; Severity of problem: moderate  Patient and/or Family's Strengths/Protective Factors: Social connections, Social and Emotional competence, Concrete supports in place (healthy food, safe environments, etc.), and Physical Health (exercise, healthy diet, medication compliance, etc.)  Goals Addressed: Patient will:  Reduce symptoms of: anxiety, depression, and stress   Increase knowledge and/or ability of: coping skills, healthy habits, and stress reduction   Demonstrate ability to: Increase healthy adjustment to current life  circumstances and Increase adequate support systems for patient/family  Progress towards Goals: Ongoing    Interventions: Interventions utilized:  Solution-Focused Strategies, Supportive Counseling, Psychoeducation and/or Health Education, and Supportive Reflection Standardized Assessments completed: Not Needed    Patient and/or Family Response: The patient attended a scheduled virtual session and shared that she is currently living in an extended stay with her three children due to poor living conditions in her apartment.  She works as a Transport planner at [employer] and was recently offered behavioral health services due to symptoms of anxiety and depression while at a recent appointment. The patient described cognitive overload, stating she often slurs her words or drags out sentences due to thinking about multiple things simultaneously. Although she has no formal mental health diagnosis, she was recently prescribed Zoloft  and has been taking it for about a week, without noticing significant improvement. She reported low motivation, difficulty getting out of bed, and feelings of emotional distress significant enough to request that her mother care for her children to shield them from her current state. Despite this, the patient is making efforts to remain busy and engaged with her children as a coping mechanism. She also noted she is currently seeking new employment. Supportive counseling focused on normalizing her experience, encouraging continued use of positive coping strategies, and reinforcing the importance of follow-up care and medication monitoring.   Clinical Assessment/Diagnosis  Adjustment disorder with mixed anxiety and depressed mood    Assessment: Patient currently experiencing xperiencing high levels of anxiety, low motivation, and emotional distress due to unstable housing conditions, parenting stress, and work-related pressures. She is adjusting to a new medication  (Zoloft ) and is actively seeking both emotional relief and a new job.   Patient may benefit from continued support from integrated behavioral health.  Plan: Follow up with behavioral health clinician on : 02/05/24 Behavioral recommendations: patient to  continue behavioral health services to address anxiety and emotional overwhelm, while following up with her prescriber regarding Zoloft  effectiveness. She may also benefit from connection to community resources for housing assistance and employment support. Patient will follow up with [employer] and Legal Aide.  Referral(s): Integrated [employer] (In Clinic)  I discussed the assessment and treatment plan with the patient and/or parent/guardian. They were provided an opportunity to ask questions and all were answered. They agreed with the plan and demonstrated an understanding of the instructions.   They were advised to call back or seek an in-person evaluation if the symptoms worsen or if the condition fails to improve as anticipated.  Davione Lenker LITTIE Seats, LCSWA

## 2024-02-05 ENCOUNTER — Ambulatory Visit: Payer: Self-pay | Admitting: Licensed Clinical Social Worker

## 2024-02-05 DIAGNOSIS — Z91199 Patient's noncompliance with other medical treatment and regimen due to unspecified reason: Secondary | ICD-10-CM

## 2024-02-05 NOTE — BH Specialist Note (Signed)
 [facility] sent virtual link to patient. [facility] contacted patient on this date. Patient no showed for today's visit.

## 2024-02-18 ENCOUNTER — Ambulatory Visit

## 2024-02-18 VITALS — BP 125/80 | HR 79

## 2024-02-18 DIAGNOSIS — Z3201 Encounter for pregnancy test, result positive: Secondary | ICD-10-CM | POA: Diagnosis not present

## 2024-02-18 LAB — POCT URINE PREGNANCY: Preg Test, Ur: POSITIVE — AB

## 2024-02-18 NOTE — Progress Notes (Signed)
 Tamara Vasquez here for a UPT. Pt had a positive upt at home. LMP is unknown. Pt has not had a cycle in 4 years. Recently had BC removed in May 2025. Informed pt we will do HCG blood draw to determine an estimate of how far along she is, so we can schedule ultrasound adequately.  UPT in office Positive.    Reviewed medications and informed to start a PNV, if not already. Pt to follow up in TBD for New OB Intake visit.

## 2024-02-19 ENCOUNTER — Ambulatory Visit: Payer: Self-pay | Admitting: Obstetrics and Gynecology

## 2024-02-19 LAB — BETA HCG QUANT (REF LAB): hCG Quant: 2016 m[IU]/mL

## 2024-02-25 ENCOUNTER — Ambulatory Visit: Admitting: Advanced Practice Midwife

## 2024-03-11 ENCOUNTER — Encounter

## 2024-03-13 ENCOUNTER — Ambulatory Visit: Admitting: *Deleted

## 2024-03-13 ENCOUNTER — Other Ambulatory Visit (INDEPENDENT_AMBULATORY_CARE_PROVIDER_SITE_OTHER): Payer: Self-pay

## 2024-03-13 VITALS — BP 107/74 | HR 76 | Wt 172.8 lb

## 2024-03-13 DIAGNOSIS — Z3A01 Less than 8 weeks gestation of pregnancy: Secondary | ICD-10-CM

## 2024-03-13 DIAGNOSIS — Z3480 Encounter for supervision of other normal pregnancy, unspecified trimester: Secondary | ICD-10-CM | POA: Insufficient documentation

## 2024-03-13 DIAGNOSIS — O3680X0 Pregnancy with inconclusive fetal viability, not applicable or unspecified: Secondary | ICD-10-CM

## 2024-03-13 DIAGNOSIS — O3680X1 Pregnancy with inconclusive fetal viability, fetus 1: Secondary | ICD-10-CM

## 2024-03-13 DIAGNOSIS — O021 Missed abortion: Secondary | ICD-10-CM

## 2024-03-13 DIAGNOSIS — O219 Vomiting of pregnancy, unspecified: Secondary | ICD-10-CM

## 2024-03-13 MED ORDER — DOXYLAMINE-PYRIDOXINE 10-10 MG PO TBEC: 2.0000 | DELAYED_RELEASE_TABLET | Freq: Every day | ORAL | 5 refills | Status: DC

## 2024-03-13 MED ORDER — PROMETHAZINE HCL 25 MG PO TABS: 25.0000 mg | ORAL_TABLET | Freq: Four times a day (QID) | ORAL | 1 refills | Status: DC | PRN

## 2024-03-13 MED ORDER — BLOOD PRESSURE KIT DEVI: 1.0000 | 0 refills | Status: AC

## 2024-03-13 NOTE — Progress Notes (Signed)
 GS only seen on today's US . No YS or FP visualized. Average GS measurement is 21.64mm=[redacted]w[redacted]d GA. IUD removed 12/25/23. No menses since then. +UPT and bHCG=2,016 on 02/18/24. Dr. Jeralyn was consulted. Repeat bHCG ordered and collected today. Follow up will be based on those results. Message sent to providers on call over the weekend for follow up. Pt verbalized understanding of the plan. Condolences and emotional support provided.

## 2024-03-13 NOTE — Progress Notes (Signed)
 New OB Intake  I connected with Tamara Vasquez  on 03/13/24 at  9:00 AM EDT by In Person Visit and verified that I am speaking with the correct person using two identifiers. Nurse is located at [facility] and pt is located at [place].  I discussed the limitations, risks, security and privacy concerns of performing an evaluation and management service by telephone and the availability of in person appointments. I also discussed with the patient that there may be a patient responsible charge related to this service. The patient expressed understanding and agreed to proceed.  I explained I am completing New OB Intake today. We discussed EDD of NA based on NA (no viable IUP on today's scan). Pt is [id number]. I reviewed her allergies, medications and Medical/Surgical/OB history.    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 Discussed the nature of our practice with multiple providers including residents and students as well as female and female providers. Due to the size of the practice, the delivering provider may not be the same as those providing prenatal care.   Patient is interested in water birth.  MyChart/Babyscripts MyChart access verified. I explained pt will have some visits in office and some virtually. Babyscripts instructions given and order placed. Patient verifies receipt of registration text/e-mail. Account successfully created and app downloaded. If patient is a candidate for Optimized scheduling, add to sticky note.   Blood Pressure Cuff/Weight Scale Blood pressure cuff ordered for patient to pick-up from [employer]. Explained after first prenatal appt pt will check weekly and document in Babyscripts. Patient does not have weight scale; patient may purchase  if they desire to track weight weekly in Babyscripts.  Anatomy US  Explained first scheduled US  will be around 19 weeks. Anatomy US  scheduled for TBD at TBD.   Is patient a candidate for Babyscripts Optimization? No, due to NA   First visit review I reviewed new OB appt with patient. Explained pt will be seen by TBD at first visit. Discussed [place] genetic screening with patient. NA Panorama and Horizon.. Routine prenatal labs OB Urine and repeat bHCG collected at today's visit.   Last Pap Diagnosis  Date Value Ref Range Status  12/25/2023   Final   - Negative for Intraepithelial Lesions or Malignancy (NILM)  12/25/2023 - Benign reactive/reparative changes  Final    Rocky Tamara Ober, RN 03/13/2024  9:44 AM

## 2024-03-13 NOTE — Patient Instructions (Signed)
 Early Miscarriage (Anembryonic Pregnancy): What to Know  An anembryonic pregnancy, also known as a blighted ovum, is a common kind of early pregnancy loss. It's also called an early miscarriage. This happens when a fertilized egg attaches to the wall of the uterus but doesn't grow. An early miscarriage can be difficult. You may have physical symptoms of the loss. You may also have strong emotions. What are the causes? An early miscarriage is usually caused by a problem with the genes in the fertilized egg. What are the signs or symptoms? Early symptoms are the same as those of a normal early pregnancy. They include: A missed menstrual period. Tiredness. Feeling like you may throw up. Sore breasts. Later symptoms are those of pregnancy loss. They include: Cramps in your belly. Bleeding in the vagina. A period that's heavier than usual. How is this diagnosed? Early miscarriage is usually diagnosed with an ultrasound. It can be confirmed with blood tests. How is this treated? It may be treated by: Waiting until your body naturally gets rid of the empty egg sac and placenta (miscarriage). Taking medicine to start a miscarriage. Having a procedure to remove the tissue (dilation and curettage, or D&C). Follow these instructions at home: Take your medicines only as told. To help you and your partner with the grieving process, talk with your health care provider or get counseling. Talk with your provider about future pregnancies and pregnancy planning. Having this condition does not mean you will lose future pregnancies. Keep all follow-up visits. Your provider may want to do blood tests to check your hormone levels after the loss. Where to find more information [employer] of Obstetricians and Gynecologists: [email or web address].org Contact a health care provider if: You have a fever or chills. You have bleeding in the vagina that goes on for longer than expected. You continue to feel sad after  the loss of your pregnancy. Get help right away if: You have any of these physical symptoms: You have very bad pain in your belly. You feel dizzy like you might pass out. You pass out. You have very heavy bleeding in the vagina. Your bleeding is very heavy if blood soaks through 2 large pads an hour for more than 2 hours. You have any of these emotional symptoms: You feel sad, down, depressed, or hopeless. Your sad feelings are taking over your thoughts. You feel like you may hurt yourself or others. These symptoms may be an emergency. Take one of these steps right away: Go to your nearest emergency room. Call 911. Call the Suicide & Crisis Lifeline (free and confidential): Call 336 767 4896 or 988. Text (563)827-6539. If you're a Veteran: Call 988 and press 1. Text the [employer] at 978-253-5859. This information is not intended to replace advice given to you by your health care provider. Make sure you discuss any questions you have with your health care provider. Document Revised: 03/24/2023 Document Reviewed: 03/24/2023 Elsevier Patient Education  2024 [employer].

## 2024-03-14 ENCOUNTER — Encounter: Payer: Self-pay | Admitting: Obstetrics and Gynecology

## 2024-03-14 LAB — BETA HCG QUANT (REF LAB): hCG Quant: 68272 m[IU]/mL

## 2024-03-15 LAB — CULTURE, OB URINE

## 2024-03-15 LAB — URINE CULTURE, OB REFLEX

## 2024-03-16 ENCOUNTER — Inpatient Hospital Stay (HOSPITAL_COMMUNITY)

## 2024-03-16 ENCOUNTER — Inpatient Hospital Stay (HOSPITAL_COMMUNITY)
Admission: AD | Admit: 2024-03-16 | Discharge: 2024-03-16 | Disposition: A | Discharge status: Home / Self Care | Attending: Family Medicine | Admitting: Family Medicine

## 2024-03-16 ENCOUNTER — Ambulatory Visit: Payer: Self-pay | Admitting: Obstetrics and Gynecology

## 2024-03-16 ENCOUNTER — Telehealth: Payer: Self-pay | Admitting: Obstetrics and Gynecology

## 2024-03-16 ENCOUNTER — Encounter (HOSPITAL_COMMUNITY): Payer: Self-pay | Admitting: Obstetrics & Gynecology

## 2024-03-16 DIAGNOSIS — O021 Missed abortion: Secondary | ICD-10-CM | POA: Diagnosis not present

## 2024-03-16 DIAGNOSIS — Z3A01 Less than 8 weeks gestation of pregnancy: Secondary | ICD-10-CM | POA: Diagnosis not present

## 2024-03-16 DIAGNOSIS — O3680X0 Pregnancy with inconclusive fetal viability, not applicable or unspecified: Secondary | ICD-10-CM | POA: Insufficient documentation

## 2024-03-16 HISTORY — DX: Urinary tract infection, site not specified: N39.0

## 2024-03-16 HISTORY — DX: Unspecified ovarian cyst, unspecified side: N83.209

## 2024-03-16 MED ORDER — IBUPROFEN 600 MG PO TABS: 600.0000 mg | ORAL_TABLET | Freq: Four times a day (QID) | ORAL | 1 refills | Status: AC | PRN

## 2024-03-16 MED ORDER — MISOPROSTOL 200 MCG PO TABS: ORAL_TABLET | ORAL | 1 refills | Status: AC

## 2024-03-16 NOTE — Telephone Encounter (Signed)
 Called, confirmed ID x2. No pain or bleeding. No cramping or pain.  Having nausea without vomiting. 4th pregnancy.      Component Ref Range & Units (hover) 3 d ago 3 wk ago  hCG Vljwu 31,727 2,016 CM      US  with gestational sac without clear yolk sac, no visualization of adnexa   Recommend detailed US  w/ visualization of adnexa, re-evaluate intrauterine contents and quant.   Patient has to appear in court this AM but able to present to MAU afterwards. Reviewed signs/symptoms to prompt presentation sooner.    Called MAU to review case.   Carter Quarry, MD, FACOG Minimally Invasive Gynecologic Surgery  Obstetrics and Gynecology, [facility], [facility] 03/16/2024

## 2024-03-16 NOTE — MAU Provider Note (Signed)
 [facility] OB[facility]N Attending MAU Note  Chief Complaint: PUL, preg of unknown location    None     SUBJECTIVE Tamara Vasquez is a 28 y.o. [phone] at Unknown by LMP who presents with PUL. Previous u/s on 8/15 showed IUGS with sac > 2.5 cm. BHCG was 68K without yolk sac and no fetal pole.  She returns today for same. No pain or bleeding.   Past Medical History:  Diagnosis Date   Anxiety    Ovarian cyst    UTI (urinary tract infection)    OB History  Gravida Para Term Preterm AB Living  4 3 3  0 0 3  SAB IAB Ectopic Multiple Live Births  0 0 0 0 3    # Outcome Date GA Lbr Len/2nd Weight Sex Type Anes PTL Lv  4 Current           3 Term 03/12/20 [redacted]w[redacted]d 03:17 / 00:21 3045 g M Vag-Spont None  LIV  2 Term 04/27/19    M Vag-Spont     1 Term 06/05/17    F Vag-Spont      Past Surgical History:  Procedure Laterality Date   NO PAST SURGERIES     Social History   Socioeconomic History   Marital status: Married    Spouse name: Not on file   Number of children: 2   Years of education: Not on file   Highest education level: Not on file  Occupational History   Occupation: UNEMPLOYED  Tobacco Use   Smoking status: Never   Smokeless tobacco: Never  Vaping Use   Vaping status: Never Used  Substance and Sexual Activity   Alcohol use: Not Currently   Drug use: Never   Sexual activity: Yes    Partners: Male  Other Topics Concern   Not on file  Social History Narrative   Not on file   Social Drivers of Health   Financial Resource Strain: Not on file  Food Insecurity: Not on file  Transportation Needs: Not on file  Physical Activity: Not on file  Stress: Not on file  Social Connections: Not on file  Intimate Partner Violence: Not on file   No current facility-administered medications on file prior to encounter.   Current Outpatient Medications on File Prior to Encounter  Medication Sig Dispense Refill   busPIRone  (BUSPAR ) 5 MG tablet Take 1 tablet (5 mg total) by  mouth 3 (three) times daily as needed. 60 tablet 2   sertraline  (ZOLOFT ) 50 MG tablet Take 1 tablet (50 mg total) by mouth daily. 30 tablet 3   Blood Pressure Monitoring (BLOOD PRESSURE KIT) DEVI 1 Device by Does not apply route once a week. 1 each 0   Doxylamine -Pyridoxine  (DICLEGIS ) 10-10 MG TBEC Take 2 tablets by mouth at bedtime. If symptoms persist, add one tablet in the morning and one in the afternoon 100 tablet 5   Prenatal Vit-Fe Fumarate-FA (PRENATAL VITAMINS PO) Take 1 tablet by mouth daily.     promethazine  (PHENERGAN ) 25 MG tablet Take 1 tablet (25 mg total) by mouth every 6 (six) hours as needed for nausea or vomiting. 30 tablet 1   sertraline  (ZOLOFT ) 25 MG tablet Take 1 tablet (25 mg total) by mouth daily for 7 days. 7 tablet 0   Allergies  Allergen Reactions   Coconut Flavoring Agent (Non-Screening)     ROS: Pertinent items in HPI  OBJECTIVE BP 114/72 (BP Location: Right Arm)   Pulse 75   Temp 98.5 F (  36.9 C) (Oral)   Resp 17   Ht 5' 1 (1.549 m)   Wt 76.2 kg   LMP  (LMP Unknown)   SpO2 100%   BMI 31.74 kg/m  CONSTITUTIONAL: Well-developed, well-nourished female in no acute distress.  HENT:  Normocephalic, atraumatic, External right and left ear normal. Oropharynx is clear and moist EYES: Conjunctivae and EOM are normal. No scleral icterus.  NECK: Normal range of motion, supple, no masses.  Normal thyroid.  SKIN: Skin is warm and dry. No rash noted. Not diaphoretic. No erythema. No pallor. NEUROLGIC: Alert and oriented to person, place, and time.  CARDIOVASCULAR: Normal heart rate noted RESPIRATORY: Effort and breath sounds normal, no problems with respiration noted. ABDOMEN: Soft, normal bowel sounds, no distention noted.  No tenderness, rebound or guarding.   LAB RESULTS No results found for this or any previous visit (from the past 48 hours).  IMAGING US  OB LESS THAN 14 WEEKS WITH OB TRANSVAGINAL Result Date: 03/16/2024 CLINICAL DATA:  Pregnancy with  inconclusive fetal viability EXAM: OBSTETRIC <14 WK US  AND TRANSVAGINAL OB US  TECHNIQUE: Both transabdominal and transvaginal ultrasound examinations were performed for complete evaluation of the gestation as well as the maternal uterus, adnexal regions, and pelvic cul-de-sac. Transvaginal technique was performed to assess early pregnancy. COMPARISON:  03/13/2024 FINDINGS: Intrauterine gestational sac: Single intrauterine gestational sac. Yolk sac:  Not Visualized. Embryo:  Not Visualized. MSD: 22.4 mm   7 w   1 d Subchorionic hemorrhage: Small Maternal uterus/adnexae: Ovaries are within normal limits. Right ovary measures 2.9 x 2.6 x 1.8 cm. Left ovary measures 2 by 2.4 x 2.6 cm. No significant free fluid IMPRESSION: Single intrauterine gestational sac with mean sac diameter of 22.4 mm but no evidence for yolk sac or embryo. Findings are suspicious but not yet definitive for failed pregnancy. Recommend follow-up US  in 10-14 days for definitive diagnosis. This recommendation follows SRU consensus guidelines: Diagnostic Criteria for Nonviable Pregnancy Early in the First Trimester. LOISE Diedra PARAS Med 2013; 630:8556-48. Small subchorionic hemorrhage Electronically Signed   By: Luke Bun M.D.   On: 03/16/2024 17:31    MAU COURSE U/s reviewed with pt. Given her prior u/s and smaller sac and nothing in the sac, suspect missed AB  ASSESSMENT 1. Missed ab   2. [redacted] weeks gestation of pregnancy     PLAN Discharge home We discussed all options including repeat u/s in 10-14 days, expectant management, D & C or Cytotec . She prefers Cytotec . She wants to get pregnant again soon. Discussed common causes at early gestational age, possible genetics investigation (she declines). Discussed impossible to prevent, predict and likelihood of repeat. Normal grieving process.    Allergies as of 03/16/2024       Reactions   Coconut Flavoring Agent (non-screening)         Medication List     STOP taking these  medications    Doxylamine -Pyridoxine  10-10 MG Tbec Commonly known as: Diclegis    promethazine  25 MG tablet Commonly known as: PHENERGAN        TAKE these medications    Blood Pressure Kit Devi 1 Device by Does not apply route once a week.   busPIRone  5 MG tablet Commonly known as: BUSPAR  Take 1 tablet (5 mg total) by mouth 3 (three) times daily as needed.   ibuprofen  600 MG tablet Commonly known as: ADVIL  Take 1 tablet (600 mg total) by mouth every 6 (six) hours as needed.   misoprostol  200 MCG tablet Commonly known as: CYTOTEC   Place five tablets in between your gums and cheeks as instructed or insert five tablets vaginally   PRENATAL VITAMINS PO Take 1 tablet by mouth daily.   sertraline  25 MG tablet Commonly known as: Zoloft  Take 1 tablet (25 mg total) by mouth daily for 7 days.   sertraline  50 MG tablet Commonly known as: Zoloft  Take 1 tablet (50 mg total) by mouth daily.        Evaluation does not show pathology that would require ongoing emergent intervention or inpatient treatment. Patient is hemodynamically stable and mentating appropriately. Discussed findings and plan with patient, who agrees with care plan. All questions answered. Return precautions discussed and outpatient follow up recommendations given.  Fredirick Glenys RAMAN, MD 03/16/2024 6:35 PM

## 2024-03-16 NOTE — MAU Note (Signed)
 Tamara Vasquez is a 28 y.o. here in MAU reporting: found out pre a few wks ago.  On Friday, HCG level continuing to go up (31,727).  Had first US  only showed gest sac.  Sent in from [facility] for further scanning, ? Ectopic further eval LMP: went straight on depo after last delivery, never had cycle.  03/12/2020. Then switched to IUD.  IUD removed 12/25/2023  Denies any pain,  has had some mild cramping, thought normal.  Has had no bleeding.  Onset of complaint: none really, 7/22 preg confirmed first HCG 2016. Pain score: none Vitals:   03/16/24 1612  BP: 114/72  Pulse: 75  Resp: 17  Temp: 98.5 F (36.9 C)  SpO2: 100%      Lab orders placed from triage:

## 2024-03-26 ENCOUNTER — Telehealth: Payer: Self-pay

## 2024-03-26 NOTE — Telephone Encounter (Signed)
 Attempted TC to pt to f/u on MAU visit. No answer, left VM with callback. Also informed pt I would send a my chart message that she can respond to if that is easier.

## 2024-04-07 ENCOUNTER — Encounter: Payer: Self-pay | Admitting: Obstetrics and Gynecology

## 2024-04-07 ENCOUNTER — Ambulatory Visit (INDEPENDENT_AMBULATORY_CARE_PROVIDER_SITE_OTHER): Admitting: Obstetrics and Gynecology

## 2024-04-07 VITALS — BP 98/71 | HR 80 | Wt 165.8 lb

## 2024-04-07 DIAGNOSIS — O039 Complete or unspecified spontaneous abortion without complication: Secondary | ICD-10-CM

## 2024-04-07 DIAGNOSIS — Z5189 Encounter for other specified aftercare: Secondary | ICD-10-CM

## 2024-04-07 DIAGNOSIS — Z30011 Encounter for initial prescription of contraceptive pills: Secondary | ICD-10-CM

## 2024-04-07 MED ORDER — LEVONORGESTREL-ETHINYL ESTRAD 0.15-30 MG-MCG PO TABS: 1.0000 | ORAL_TABLET | Freq: Every day | ORAL | 11 refills | Status: AC

## 2024-04-07 NOTE — Progress Notes (Signed)
 Had IUD taken out in May 2025. Pregnancy confirmation in July;US  on 03/13/24/ showed sac but no baby.  SAB 03/18/24- 03/23/24; given meds by MAU. Not currently bleeding.   Pt feels she may currently have a sinus infection.

## 2024-04-07 NOTE — Progress Notes (Signed)
   ESTABLISHED GYNECOLOGY VISIT No chief complaint on file.   Subjective:  Tamara Vasquez is a 28 y.o. [phone] presenting for miscarriage follow up.  Seen in MAU on 03/16/24 and diagnosed with miscarriage. She was prescribed cytotec , which she took. She subsequently had several days of bleeding/passage of tissue and bled for a total of 10 days and has now stopped bleeding. Denies pain.  Would like to hold off on becoming pregnant at this point. Interested in birth control pills.   Review of Systems:   Pertinent items are noted in HPI  Pertinent History Reviewed:  Reviewed past medical,surgical, social and family history.  Reviewed problem list, medications and allergies.  Objective:   Vitals:   04/07/24 1019  BP: 98/71  Pulse: 80  Weight: 165 lb 12.8 oz (75.2 kg)   Physical Examination:   General appearance - well appearing, and in no distress  Mental status - alert, oriented to person, place, and time  Psych:  normal mood and affect    Assessment and Plan:  1. Follow-up visit after miscarriage (Primary) Recommend pelvic ultrasound and HCG to confirm complete miscarriage Support given, anticipatory guidance - US  PELVIC COMPLETE WITH TRANSVAGINAL; Future - Beta hCG quant (ref lab)  2. Encounter for initial prescription of contraceptive pills Patient interested in OCPs. Denies history of hypertension, venous thromboembolism, migraines with aura, liver disease or smoking. - levonorgestrel -ethinyl estradiol (PORTIA-28) 0.15-30 MG-MCG tablet; Take 1 tablet by mouth daily.  Dispense: 28 tablet; Refill: 11   Future Appointments  Date Time Provider Department Center  04/07/2024 10:35 AM Vern Prestia, Rollo DASEN, MD [facility] None    Rollo DASEN Bring, MD, FACOG Obstetrician & Gynecologist, [facility] for [facility], [facility]

## 2024-04-08 ENCOUNTER — Ambulatory Visit: Payer: Self-pay | Admitting: Obstetrics and Gynecology

## 2024-04-08 DIAGNOSIS — O039 Complete or unspecified spontaneous abortion without complication: Secondary | ICD-10-CM

## 2024-04-08 LAB — BETA HCG QUANT (REF LAB): hCG Quant: 99 m[IU]/mL

## 2024-04-13 ENCOUNTER — Ambulatory Visit (HOSPITAL_COMMUNITY)
Admission: RE | Admit: 2024-04-13 | Discharge: 2024-04-13 | Disposition: A | Discharge status: Home / Self Care | Source: Ambulatory Visit | Attending: Obstetrics and Gynecology | Admitting: Obstetrics and Gynecology

## 2024-04-13 DIAGNOSIS — O039 Complete or unspecified spontaneous abortion without complication: Secondary | ICD-10-CM | POA: Insufficient documentation

## 2024-04-13 DIAGNOSIS — Z5189 Encounter for other specified aftercare: Secondary | ICD-10-CM | POA: Insufficient documentation

## 2024-04-15 ENCOUNTER — Other Ambulatory Visit
